# Patient Record
Sex: Female | Born: 2000 | Race: White | Hispanic: No | Marital: Single | Smoking: Never smoker
Health system: Southern US, Community
[De-identification: ages and names within clinical notes are randomized; demographics above are authoritative.]

## PROBLEM LIST (undated history)

## (undated) DIAGNOSIS — D649 Anemia, unspecified: Secondary | ICD-10-CM

## (undated) DIAGNOSIS — O365990 Maternal care for other known or suspected poor fetal growth, unspecified trimester, not applicable or unspecified: Secondary | ICD-10-CM

## (undated) DIAGNOSIS — J45909 Unspecified asthma, uncomplicated: Secondary | ICD-10-CM

## (undated) DIAGNOSIS — K219 Gastro-esophageal reflux disease without esophagitis: Secondary | ICD-10-CM

## (undated) DIAGNOSIS — F419 Anxiety disorder, unspecified: Secondary | ICD-10-CM

## (undated) HISTORY — DX: Unspecified asthma, uncomplicated: J45.909

## (undated) HISTORY — PX: NO PAST SURGERIES: SHX2092

---
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
## 2019-10-18 ENCOUNTER — Ambulatory Visit: Payer: Medicaid Other | Attending: Internal Medicine

## 2019-10-18 DIAGNOSIS — Z23 Encounter for immunization: Secondary | ICD-10-CM | POA: Insufficient documentation

## 2019-10-18 NOTE — Progress Notes (Signed)
   Covid-19 Vaccination Clinic  Name:  Megan Diaz    MRN: [id number] DOB: 02-11-2001  10/18/2019  Megan Diaz was observed post Covid-19 immunization for 15 minutes without incidence. She was provided with Vaccine Information Sheet and instruction to access the V-Safe system.   Megan Diaz was instructed to call 911 with any severe reactions post vaccine: Marland Kitchen Difficulty breathing  . Swelling of your face and throat  . A fast heartbeat  . A bad rash all over your body  . Dizziness and weakness    Immunizations Administered    Name Date Dose VIS Date Route   Pfizer COVID-19 Vaccine 10/18/2019 11:33 AM 0.3 mL 07/31/2019 Intramuscular   Manufacturer: [employer], [employer]   Lot: [id number]   NDC: [address]-2

## 2019-11-10 ENCOUNTER — Ambulatory Visit: Payer: Medicaid Other | Attending: Internal Medicine

## 2019-11-10 DIAGNOSIS — Z23 Encounter for immunization: Secondary | ICD-10-CM

## 2019-11-10 NOTE — Progress Notes (Signed)
   Covid-19 Vaccination Clinic  Name:  Megan Diaz    MRN: [id number] DOB: 27-Feb-2001  11/10/2019  [name] was observed post Covid-19 immunization for 15 minutes without incident. She was provided with Vaccine Information Sheet and instruction to access the V-Safe system.   [name] was instructed to call 911 with any severe reactions post vaccine: Marland Kitchen Difficulty breathing  . Swelling of face and throat  . A fast heartbeat  . A bad rash all over body  . Dizziness and weakness   Immunizations Administered    Name Date Dose VIS Date Route   Pfizer COVID-19 Vaccine 11/10/2019  3:21 PM 0.3 mL 07/31/2019 Intramuscular   Manufacturer: [employer], [employer]   Lot: [id number]   NDC: [address]-1

## 2020-07-06 ENCOUNTER — Ambulatory Visit: Payer: Medicaid Other | Admitting: Nurse Practitioner

## 2020-07-11 ENCOUNTER — Ambulatory Visit: Payer: Medicaid Other | Admitting: Nurse Practitioner

## 2021-09-22 ENCOUNTER — Other Ambulatory Visit: Payer: Self-pay

## 2021-09-22 DIAGNOSIS — O30049 Twin pregnancy, dichorionic/diamniotic, unspecified trimester: Secondary | ICD-10-CM

## 2021-10-18 LAB — OB RESULTS CONSOLE HEPATITIS B SURFACE ANTIGEN: Hepatitis B Surface Ag: NEGATIVE

## 2021-10-18 LAB — OB RESULTS CONSOLE RPR: RPR: NONREACTIVE

## 2021-10-18 LAB — OB RESULTS CONSOLE GC/CHLAMYDIA
Chlamydia: NEGATIVE
Neisseria Gonorrhea: NEGATIVE

## 2021-10-18 LAB — OB RESULTS CONSOLE VARICELLA ZOSTER ANTIBODY, IGG: Varicella: IMMUNE

## 2021-10-18 LAB — OB RESULTS CONSOLE RUBELLA ANTIBODY, IGM: Rubella: IMMUNE

## 2021-10-18 LAB — OB RESULTS CONSOLE HIV ANTIBODY (ROUTINE TESTING): HIV: NONREACTIVE

## 2021-10-19 ENCOUNTER — Other Ambulatory Visit: Payer: Self-pay

## 2021-10-19 DIAGNOSIS — O30042 Twin pregnancy, dichorionic/diamniotic, second trimester: Secondary | ICD-10-CM

## 2021-10-24 ENCOUNTER — Ambulatory Visit (HOSPITAL_BASED_OUTPATIENT_CLINIC_OR_DEPARTMENT_OTHER): Payer: BC Managed Care – PPO

## 2021-10-24 ENCOUNTER — Ambulatory Visit: Payer: BC Managed Care – PPO | Attending: Obstetrics and Gynecology

## 2021-10-24 ENCOUNTER — Ambulatory Visit (HOSPITAL_BASED_OUTPATIENT_CLINIC_OR_DEPARTMENT_OTHER): Payer: BC Managed Care – PPO | Admitting: Obstetrics and Gynecology

## 2021-10-24 ENCOUNTER — Other Ambulatory Visit: Payer: Self-pay

## 2021-10-24 ENCOUNTER — Other Ambulatory Visit: Payer: Self-pay | Admitting: Obstetrics and Gynecology

## 2021-10-24 DIAGNOSIS — Z79899 Other long term (current) drug therapy: Secondary | ICD-10-CM | POA: Insufficient documentation

## 2021-10-24 DIAGNOSIS — F419 Anxiety disorder, unspecified: Secondary | ICD-10-CM | POA: Diagnosis not present

## 2021-10-24 DIAGNOSIS — Z3A15 15 weeks gestation of pregnancy: Secondary | ICD-10-CM

## 2021-10-24 DIAGNOSIS — Z8279 Family history of other congenital malformations, deformations and chromosomal abnormalities: Secondary | ICD-10-CM

## 2021-10-24 DIAGNOSIS — O30042 Twin pregnancy, dichorionic/diamniotic, second trimester: Secondary | ICD-10-CM

## 2021-10-24 DIAGNOSIS — O99342 Other mental disorders complicating pregnancy, second trimester: Secondary | ICD-10-CM | POA: Diagnosis not present

## 2021-10-24 DIAGNOSIS — O99512 Diseases of the respiratory system complicating pregnancy, second trimester: Secondary | ICD-10-CM | POA: Insufficient documentation

## 2021-10-24 DIAGNOSIS — J45909 Unspecified asthma, uncomplicated: Secondary | ICD-10-CM | POA: Diagnosis not present

## 2021-10-24 DIAGNOSIS — Z363 Encounter for antenatal screening for malformations: Secondary | ICD-10-CM | POA: Diagnosis not present

## 2021-10-24 DIAGNOSIS — Z7982 Long term (current) use of aspirin: Secondary | ICD-10-CM | POA: Diagnosis not present

## 2021-10-24 DIAGNOSIS — O30049 Twin pregnancy, dichorionic/diamniotic, unspecified trimester: Secondary | ICD-10-CM

## 2021-10-24 NOTE — Progress Notes (Signed)
Megan Diaz Length of Consultation: 30 minutes   Megan Diaz  was referred to [facility] at [facility] for genetic counseling to review prenatal screening and testing options for this twin pregnancy.  In the family history, there is a history of Turner syndrome and Down syndrome.  This note summarizes the information we discussed with the patient and her mother at this visit.  She also had an ultrasound at this visit.  See that report under separate cover.    We offered the following routine screening tests for this pregnancy:  Cell free fetal DNA testing from maternal blood may be used to determine whether a baby is at high risk for Down syndrome, trisomy 104, or trisomy 4.  This test utilizes a maternal blood sample and DNA sequencing technology to isolate circulating cell free fetal DNA from maternal plasma.  The fetal DNA can then be analyzed for DNA sequences that are derived from the three most common chromosomes involved in aneuploidy, chromosomes 13, 18, and 21.  If the overall amount of DNA is greater than the expected level for any of these chromosomes, aneuploidy is suspected.  The detection rate for Down syndrome and trisomy 18 is >99% and the detection rate for trisomy 13 is >91%. While we do not consider it a replacement for invasive testing and karyotype analysis, a negative result from this testing would be reassuring, though not a guarantee of a normal chromosome complement for the baby.  An abnormal result is certainly suggestive of an abnormal chromosome complement, though we would still recommend CVS or amniocentesis to confirm any findings from this testing. This testing can also assess for the sex chromosomes and can detect approximately 96% of sex chromosome aneuploidies and determine fetal gender with >99% confidence.  Given the fact that this is a twin pregnancy, we would recommend testing with the Panorama screening, as it can also assess for zygosity.  Maternal  serum marker screening, a blood test that measures pregnancy proteins, can provide risk assessments for Down syndrome, trisomy 18, and open neural tube defects (spina bifida, anencephaly). Because it does not directly examine the fetus, it cannot positively diagnose or rule out these problems. This testing is less accurate in twin gestations. If cell free DNA testing is performed, then an AFP only should be made available in the second trimester to assess the chance for open neural tube defects.  Targeted ultrasound uses high frequency sound waves to create an image of the developing fetus.  An ultrasound is often recommended as a routine means of evaluating the pregnancy.  It is also used to screen for fetal anatomy problems (for example, a heart defect) that might be suggestive of a chromosomal or other abnormality.   Should these screening tests indicate an increased concern, then the following additional testing options would be offered:  Amniocentesis involves the removal of a small amount of amniotic fluid from the sac surrounding the fetus with the use of a thin needle inserted through the maternal abdomen and uterus.  Ultrasound guidance is used throughout the procedure.  Fetal cells from amniotic fluid are directly evaluated and > 99.5% of chromosome problems and > 98% of open neural tube defects can be detected. This procedure is generally performed after the 15th week of pregnancy.  The main risks to this procedure include complications leading to miscarriage in less than 1 in 200 cases (0.5%).  Cystic Fibrosis and Spinal Muscular Atrophy (SMA) screening were also discussed with the patient.  Both conditions are recessive, which means that both parents must be carriers in order to have a child with the disease.  Cystic fibrosis (CF) is one of the most common genetic conditions in persons of Caucasian ancestry.  This condition occurs in approximately 1 in 2,500 Caucasian persons and results in  thickened secretions in the lungs, digestive, and reproductive systems.  For a baby to be at risk for having CF, both of the parents must be carriers for this condition.  Approximately 1 in 58 Caucasian persons is a carrier for CF.  Current carrier testing looks for the most common mutations in the gene for CF and can detect approximately 90% of carriers in the Caucasian population.  This means that the carrier screening can greatly reduce, but cannot eliminate, the chance for an individual to have a child with CF.  If an individual is found to be a carrier for CF, then carrier testing would be available for the partner. As part of [address] newborn screening profile, all babies born in the state of [place] will have a two-tier screening process.  Specimens are first tested to determine the concentration of immunoreactive trypsinogen (IRT).  The top 5% of specimens with the highest IRT values then undergo DNA testing using a panel of over 40 common CF mutations. SMA is a neurodegenerative disorder that leads to atrophy of skeletal muscle and overall weakness.  This condition is also more prevalent in the Caucasian population, with 1 in 40-1 in 60 persons being a carrier and 1 in 6,000-1 in 10,000 children being affected.  There are multiple forms of the disease, with some causing death in infancy to other forms with survival into adulthood.  The genetics of SMA is complex, but carrier screening can detect up to 95% of carriers in the Caucasian population.  Similar to CF, a negative result can greatly reduce, but cannot eliminate, the chance to have a child with SMA. Hemoglobinopathy screening was also made available.  We obtained a detailed family history and pregnancy history, with the following concerns reported: The father of the pregnancy, Theone Murdoch, has a sister with Turner syndrome.  Turner syndrome, also known as Monosomy X (45,X) typically results from the loss of one X chromosome in affected  females. This most often occurs due to a random error in chromosomal division during the formation of egg or sperm in a process called nondisjunction. Rarely, Turner syndrome may be caused by a partial deletion of an X chromosome, which can be inherited from a parent. Because Turner syndrome is most often not inherited, recurrence is rare; however, cases of recurrence have been reported. Cell free DNA screening can assess for this condition. The patient has a maternal second cousin and another more distant maternal relative with Down syndrome.  Down syndrome is caused by an extra copy of the genetic instructions located on chromosome number 21.  These extra instructions result in the characteristic facial appearance, intellectual disabilities and an increased risk for some types of birth defects.  The majority (95%) of persons with Down syndrome have three freestanding copies of chromosome number 21, called trisomy 21.  This type of Down syndrome occurs as a sporadic condition and does not increase the chance for other family members to have Down syndrome.  Rarely, Down syndrome is caused by a rearrangement of the genetic instructions, where the extra chromosome number 21 is attached to another chromosome.  This type of chromosome rearrangement can be passed down through families and therefore  may increase the chance for Down syndrome in other family members.  We cannot determine which type of Down syndrome is present without documentation of chromosome studies in the affected family member.  If cell free DNA testing is performed, it can detect greater than 99% of pregnancies with Down syndrome. The patient also has a maternal first cousin once removed who is reported to have had seizures, developmental delay, immune issues and lung problems possibly related to neonatal infection and encephalitis.  He passed away due to complications of COVID.  If his health concerns were related to the illness/infection after  birth, then we would not expect an increased risk for any other family members. If it were due to a different, possibly inherited, underlying cause, then there may be risks.  If more is learned about the cause for his condition, we are happy to review this history further.  The remainder of the family history was reported to be unremarkable for birth defects, intellectual delays, recurrent pregnancy loss or known chromosome abnormalities.  Megan Diaz stated that this is the first pregnancy for she and her partner, Eli.  She reported no complications or exposures to medications, recreational drugs or tobacco.  She had a small amount of alcohol prior to learning that she was pregnant at approximately 5 weeks.  We reviewed the all or none period.  An ultrasound was performed at the time of the visit.  The gestational age was consistent with 15 weeks.  Fetal anatomy could not be fully assessed due to early gestational age.  Please refer to the ultrasound report for details of that study.  Ms. Bortz was encouraged to call with questions or concerns.  We can be contacted at [phone].  Plan of care: Return for anatomy ultrasound in 5 weeks in our [place] office. Speak with partner and decide about Panorama testing as well as [employer] carrier screening.  Patient has my card to call if testing is desired.  Cherly Anderson, MS, CGC

## 2021-10-24 NOTE — Progress Notes (Signed)
Maternal-Fetal Medicine  ? ?Name: Megan Diaz ?DOB: December 02, 2000 ?MRN: [id number] ?Referring Provider: Margaretmary Eddy, CNM ? ?I had the pleasure of seeing Megan Diaz today at [facility], [facility].  She was accompanied by her mother. She is G1 P0 at 15w 1d gestation with twin pregnancy and is here for ultrasound evaluation and consultation. ? ?This is a natural conception.  Her pregnancy is dated by 10-week ultrasound performed at your office.  Dichorionic/diamniotic twin pregnancy was confirmed. ? ?Past medical history: No history of hypertension or diabetes or any chronic medical conditions.  She has anxiety. ?Past surgical history: Nil of note. ?Medications: Prenatal vitamins, Lexapro 10 mg daily. ?Allergies: Cefuroxime (?hives?). ?Social history: Denies tobacco or drug or alcohol use.  Her partner is Caucasian and he is in good health. ?Family history: Mother has hypertension and diabetes.  She has no knowledge of her father's medical condition.  No history of venous thromboembolism in the family. ?GYN history: No history of abnormal Pap smears or cervical surgeries ? ?Ultrasound ?We confirm dichorionic-diamniotic twin pregnancy. ?Twin A: Lower fetus and slightly to maternal right, posterior placenta, female fetus.  Fetal biometry is consistent with the previously established dates.  Amniotic fluid is normal good fetal activity seen.  Fetal anatomical survey is very limited because of early gestational age.  No obvious fetal structural defects are seen. ?Twin B: Upper fetus, posterior placenta.  Fetal biometry is consistent with the previously established dates. Amniotic fluid is normal good fetal activity seen.  Fetal anatomical survey is very limited because of early gestational age.  No obvious fetal structural defects are seen. ?Growth discordancy: 7% (normal). ?On transabdominal scan, cervix looks long and closed. ? ?Dichorionic-diamniotic twin pregnancy ?I explained the significance of chorionicity and its  implications. Possible complications associated with twin pregnancy include preterm labor/delivery (most common), fetal growth restriction of one or both twins, miscarriage, malpresentations and increased cesarean delivery rate, postpartum hemorrhage. I also informed her that maternal complications including gestational diabetes and gestational hypertension/preeclampsia are more common. ?I discussed the mode of delivery that is based on the presentations.  If both have Vx/Vx or Vx/non-vertex presentations, vaginal delivery may be considered. In Vx/non-vx, vaginal delivery followed by internal podalic version of second twin will achieve vaginal delivery. In non-vx first twin presentation, cesarean delivery will be performed. ? ?I emphasized the importance of weight gain (24 lbs by 24 weeks) to improve fetal weight and decrease the chances of preterm delivery. ?Low-dose aspirin is beneficial in preventing preeclampsia. Twin pregnancy is at high risk for preeclampsia. ?I recommended aspirin (81 mg daily) to be started from now until delivery. She does not have contraindications to aspirin. ? ?Lexapro (Escitalopram) ?No increased fetal congenital malformations have been reported with the use of this drug in early pregnancy. A case-controlled study suggested the association of primary pulmonary hypertension (PPHN). However, the absolute risk is very small (less than 1%). Also, in the absence of other conditions like meconium aspiration syndrome and congenital diaphragmatic hernia, the chances of PPHN are low and even if they occur they tend to resolve with treatment.  About 10% to 30% of neonates exposed to SSRIs in utero tend to develop poor neonatal adaptation syndrome (irritability, tremor, seizures and resembling withdrawal from opioids) that resolve without any long-term neurological adverse outcomes. ? ?Breastfeeding is not contraindicated in patients taking SSRIs. In general, untreated depression may have adverse  effects in the pregnant mother including preterm delivery and growth restriction. Depression in late pregnancy is a strong predictor  for postpartum depression.  ?I reassured the patient of low-likelihood of fetal congenital malformation with this drug. If she does not have anxiety, she may discontinue this medication in pregnancy after discussing with you. ? ?Recommendations ?-An appointment was made for her to return in 5 weeks for detailed fetal anatomical survey. ?-Serial fetal growth assessments every 4 weeks. ?-Weekly antenatal testing at 36 and 37 weeks. ?-Delivery at 38 weeks.  ?-Weight gain of about 24 lbs by 24 weeks is recommended. ?-Aspirin 81 mg daily until delivery. ?-Patient will discuss with her partner and decide on cell-free fetal DNA screening. ? ?Thank you for consultation.  If you have any questions or concerns, please contact me the [facility].  Consultation including face-to-face counseling (more than 50% of time spent) is 45 minutes. ? ? ? ? ?

## 2021-11-28 ENCOUNTER — Ambulatory Visit: Payer: BC Managed Care – PPO | Admitting: *Deleted

## 2021-11-28 ENCOUNTER — Ambulatory Visit: Payer: BC Managed Care – PPO | Attending: Obstetrics and Gynecology

## 2021-11-28 VITALS — BP 115/81 | HR 95

## 2021-11-28 DIAGNOSIS — O30042 Twin pregnancy, dichorionic/diamniotic, second trimester: Secondary | ICD-10-CM | POA: Insufficient documentation

## 2021-11-28 DIAGNOSIS — F419 Anxiety disorder, unspecified: Secondary | ICD-10-CM

## 2021-11-28 DIAGNOSIS — O30049 Twin pregnancy, dichorionic/diamniotic, unspecified trimester: Secondary | ICD-10-CM

## 2021-11-28 DIAGNOSIS — O99342 Other mental disorders complicating pregnancy, second trimester: Secondary | ICD-10-CM | POA: Diagnosis not present

## 2021-11-28 DIAGNOSIS — O99512 Diseases of the respiratory system complicating pregnancy, second trimester: Secondary | ICD-10-CM | POA: Diagnosis not present

## 2021-11-28 DIAGNOSIS — J45909 Unspecified asthma, uncomplicated: Secondary | ICD-10-CM

## 2021-11-28 DIAGNOSIS — Z3A20 20 weeks gestation of pregnancy: Secondary | ICD-10-CM

## 2021-11-29 ENCOUNTER — Other Ambulatory Visit: Payer: Self-pay | Admitting: *Deleted

## 2021-11-29 DIAGNOSIS — J45909 Unspecified asthma, uncomplicated: Secondary | ICD-10-CM

## 2021-11-29 DIAGNOSIS — F419 Anxiety disorder, unspecified: Secondary | ICD-10-CM

## 2021-11-29 DIAGNOSIS — O30042 Twin pregnancy, dichorionic/diamniotic, second trimester: Secondary | ICD-10-CM

## 2021-12-28 ENCOUNTER — Ambulatory Visit: Payer: BC Managed Care – PPO | Attending: Obstetrics

## 2021-12-28 ENCOUNTER — Other Ambulatory Visit: Payer: Self-pay | Admitting: Obstetrics

## 2021-12-28 ENCOUNTER — Ambulatory Visit: Payer: BC Managed Care – PPO | Admitting: *Deleted

## 2021-12-28 VITALS — BP 120/64 | HR 74

## 2021-12-28 DIAGNOSIS — J45909 Unspecified asthma, uncomplicated: Secondary | ICD-10-CM

## 2021-12-28 DIAGNOSIS — O30042 Twin pregnancy, dichorionic/diamniotic, second trimester: Secondary | ICD-10-CM | POA: Insufficient documentation

## 2021-12-28 DIAGNOSIS — O99512 Diseases of the respiratory system complicating pregnancy, second trimester: Secondary | ICD-10-CM

## 2021-12-28 DIAGNOSIS — O99519 Diseases of the respiratory system complicating pregnancy, unspecified trimester: Secondary | ICD-10-CM | POA: Diagnosis present

## 2021-12-28 DIAGNOSIS — O99340 Other mental disorders complicating pregnancy, unspecified trimester: Secondary | ICD-10-CM | POA: Insufficient documentation

## 2021-12-28 DIAGNOSIS — Z3A24 24 weeks gestation of pregnancy: Secondary | ICD-10-CM

## 2021-12-28 DIAGNOSIS — F419 Anxiety disorder, unspecified: Secondary | ICD-10-CM

## 2021-12-28 DIAGNOSIS — O365922 Maternal care for other known or suspected poor fetal growth, second trimester, fetus 2: Secondary | ICD-10-CM

## 2021-12-28 DIAGNOSIS — O365921 Maternal care for other known or suspected poor fetal growth, second trimester, fetus 1: Secondary | ICD-10-CM

## 2021-12-29 ENCOUNTER — Other Ambulatory Visit: Payer: Self-pay | Admitting: *Deleted

## 2021-12-29 DIAGNOSIS — O30049 Twin pregnancy, dichorionic/diamniotic, unspecified trimester: Secondary | ICD-10-CM

## 2021-12-29 DIAGNOSIS — Z3689 Encounter for other specified antenatal screening: Secondary | ICD-10-CM

## 2022-01-02 ENCOUNTER — Telehealth: Payer: Self-pay

## 2022-01-02 NOTE — Telephone Encounter (Signed)
Left message about appointment added for 01/17/22 ?

## 2022-01-12 ENCOUNTER — Encounter: Payer: Self-pay | Admitting: *Deleted

## 2022-01-12 ENCOUNTER — Ambulatory Visit: Payer: BC Managed Care – PPO | Admitting: *Deleted

## 2022-01-12 ENCOUNTER — Ambulatory Visit: Payer: BC Managed Care – PPO | Attending: Obstetrics and Gynecology

## 2022-01-12 DIAGNOSIS — F419 Anxiety disorder, unspecified: Secondary | ICD-10-CM | POA: Diagnosis not present

## 2022-01-12 DIAGNOSIS — O365922 Maternal care for other known or suspected poor fetal growth, second trimester, fetus 2: Secondary | ICD-10-CM | POA: Diagnosis not present

## 2022-01-12 DIAGNOSIS — O99342 Other mental disorders complicating pregnancy, second trimester: Secondary | ICD-10-CM

## 2022-01-12 DIAGNOSIS — O365921 Maternal care for other known or suspected poor fetal growth, second trimester, fetus 1: Secondary | ICD-10-CM | POA: Diagnosis not present

## 2022-01-12 DIAGNOSIS — J45909 Unspecified asthma, uncomplicated: Secondary | ICD-10-CM

## 2022-01-12 DIAGNOSIS — O99512 Diseases of the respiratory system complicating pregnancy, second trimester: Secondary | ICD-10-CM

## 2022-01-12 DIAGNOSIS — Z3689 Encounter for other specified antenatal screening: Secondary | ICD-10-CM | POA: Diagnosis present

## 2022-01-12 DIAGNOSIS — Z3A26 26 weeks gestation of pregnancy: Secondary | ICD-10-CM

## 2022-01-12 DIAGNOSIS — O30049 Twin pregnancy, dichorionic/diamniotic, unspecified trimester: Secondary | ICD-10-CM | POA: Insufficient documentation

## 2022-01-17 ENCOUNTER — Ambulatory Visit: Payer: BC Managed Care – PPO | Admitting: *Deleted

## 2022-01-17 ENCOUNTER — Ambulatory Visit: Payer: BC Managed Care – PPO | Attending: Obstetrics and Gynecology

## 2022-01-17 VITALS — BP 113/64 | HR 91

## 2022-01-17 DIAGNOSIS — O30049 Twin pregnancy, dichorionic/diamniotic, unspecified trimester: Secondary | ICD-10-CM | POA: Insufficient documentation

## 2022-01-17 DIAGNOSIS — O99342 Other mental disorders complicating pregnancy, second trimester: Secondary | ICD-10-CM

## 2022-01-17 DIAGNOSIS — O30042 Twin pregnancy, dichorionic/diamniotic, second trimester: Secondary | ICD-10-CM | POA: Insufficient documentation

## 2022-01-17 DIAGNOSIS — Z3A27 27 weeks gestation of pregnancy: Secondary | ICD-10-CM

## 2022-01-17 DIAGNOSIS — O365922 Maternal care for other known or suspected poor fetal growth, second trimester, fetus 2: Secondary | ICD-10-CM

## 2022-01-17 DIAGNOSIS — O99512 Diseases of the respiratory system complicating pregnancy, second trimester: Secondary | ICD-10-CM

## 2022-01-17 DIAGNOSIS — J45909 Unspecified asthma, uncomplicated: Secondary | ICD-10-CM

## 2022-01-17 DIAGNOSIS — Z3689 Encounter for other specified antenatal screening: Secondary | ICD-10-CM | POA: Diagnosis present

## 2022-01-17 DIAGNOSIS — F419 Anxiety disorder, unspecified: Secondary | ICD-10-CM

## 2022-01-17 DIAGNOSIS — O365921 Maternal care for other known or suspected poor fetal growth, second trimester, fetus 1: Secondary | ICD-10-CM | POA: Diagnosis not present

## 2022-01-18 ENCOUNTER — Other Ambulatory Visit: Payer: Self-pay | Admitting: *Deleted

## 2022-01-18 DIAGNOSIS — O365922 Maternal care for other known or suspected poor fetal growth, second trimester, fetus 2: Secondary | ICD-10-CM

## 2022-01-18 DIAGNOSIS — O30042 Twin pregnancy, dichorionic/diamniotic, second trimester: Secondary | ICD-10-CM

## 2022-01-18 DIAGNOSIS — O365921 Maternal care for other known or suspected poor fetal growth, second trimester, fetus 1: Secondary | ICD-10-CM

## 2022-01-18 DIAGNOSIS — J45909 Unspecified asthma, uncomplicated: Secondary | ICD-10-CM

## 2022-01-23 ENCOUNTER — Ambulatory Visit: Payer: BC Managed Care – PPO | Attending: Obstetrics and Gynecology

## 2022-01-23 ENCOUNTER — Encounter: Payer: Self-pay | Admitting: *Deleted

## 2022-01-23 ENCOUNTER — Ambulatory Visit: Payer: BC Managed Care – PPO | Admitting: *Deleted

## 2022-01-23 ENCOUNTER — Ambulatory Visit (HOSPITAL_BASED_OUTPATIENT_CLINIC_OR_DEPARTMENT_OTHER): Payer: BC Managed Care – PPO | Admitting: *Deleted

## 2022-01-23 VITALS — BP 126/85 | HR 105

## 2022-01-23 DIAGNOSIS — O365922 Maternal care for other known or suspected poor fetal growth, second trimester, fetus 2: Secondary | ICD-10-CM | POA: Diagnosis present

## 2022-01-23 DIAGNOSIS — O99343 Other mental disorders complicating pregnancy, third trimester: Secondary | ICD-10-CM

## 2022-01-23 DIAGNOSIS — J45909 Unspecified asthma, uncomplicated: Secondary | ICD-10-CM | POA: Diagnosis present

## 2022-01-23 DIAGNOSIS — O30042 Twin pregnancy, dichorionic/diamniotic, second trimester: Secondary | ICD-10-CM

## 2022-01-23 DIAGNOSIS — O30043 Twin pregnancy, dichorionic/diamniotic, third trimester: Secondary | ICD-10-CM | POA: Diagnosis present

## 2022-01-23 DIAGNOSIS — O99519 Diseases of the respiratory system complicating pregnancy, unspecified trimester: Secondary | ICD-10-CM | POA: Diagnosis present

## 2022-01-23 DIAGNOSIS — Z3A28 28 weeks gestation of pregnancy: Secondary | ICD-10-CM | POA: Insufficient documentation

## 2022-01-23 DIAGNOSIS — O365931 Maternal care for other known or suspected poor fetal growth, third trimester, fetus 1: Secondary | ICD-10-CM | POA: Insufficient documentation

## 2022-01-23 DIAGNOSIS — O365921 Maternal care for other known or suspected poor fetal growth, second trimester, fetus 1: Secondary | ICD-10-CM | POA: Diagnosis present

## 2022-01-23 DIAGNOSIS — O365932 Maternal care for other known or suspected poor fetal growth, third trimester, fetus 2: Secondary | ICD-10-CM | POA: Insufficient documentation

## 2022-01-23 DIAGNOSIS — F419 Anxiety disorder, unspecified: Secondary | ICD-10-CM

## 2022-01-23 NOTE — Procedures (Signed)
Megan Diaz 2001/06/29 [redacted]w[redacted]d   Fetus B Non-Stress Test Interpretation for 01/23/22  Indication: IUGR  Fetal Heart Rate Fetus B Mode: External Baseline Rate (B): 140 BPM Variability: Moderate Accelerations: 10 x 10 Decelerations: None  Uterine Activity Mode: Palpation, Toco Contraction Frequency (min): ui Resting Tone Palpated: Relaxed  Interpretation (Baby B - Fetal Testing) Nonstress Test Interpretation (Baby B): Reactive Overall Impression (Baby B): Reassuring for gestational age Comments (Baby B): Dr. Parke Poisson reviewed tracing  Megan Diaz [birth year] [redacted]w[redacted]d  Fetus A Non-Stress Test Interpretation for 01/23/22  Indication: IUGR  Fetal Heart Rate A Mode: External Baseline Rate (A): 140 bpm Variability: Moderate Accelerations: 10 x 10 Decelerations: None Multiple birth?: Yes  Uterine Activity Mode: Palpation, Toco Contraction Frequency (min): ui Resting Tone Palpated: Relaxed  Interpretation (Fetal Testing) Nonstress Test Interpretation: Reactive Overall Impression: Reassuring for gestational age Comments: Dr. Parke Poisson reviewed tracing.

## 2022-02-02 ENCOUNTER — Ambulatory Visit: Payer: BC Managed Care – PPO | Attending: Obstetrics and Gynecology

## 2022-02-02 ENCOUNTER — Ambulatory Visit (HOSPITAL_BASED_OUTPATIENT_CLINIC_OR_DEPARTMENT_OTHER): Payer: BC Managed Care – PPO | Admitting: *Deleted

## 2022-02-02 ENCOUNTER — Ambulatory Visit: Payer: BC Managed Care – PPO | Admitting: *Deleted

## 2022-02-02 ENCOUNTER — Other Ambulatory Visit: Payer: Self-pay | Admitting: *Deleted

## 2022-02-02 VITALS — BP 124/66 | HR 76

## 2022-02-02 DIAGNOSIS — O99519 Diseases of the respiratory system complicating pregnancy, unspecified trimester: Secondary | ICD-10-CM | POA: Diagnosis present

## 2022-02-02 DIAGNOSIS — O365930 Maternal care for other known or suspected poor fetal growth, third trimester, not applicable or unspecified: Secondary | ICD-10-CM | POA: Insufficient documentation

## 2022-02-02 DIAGNOSIS — O365931 Maternal care for other known or suspected poor fetal growth, third trimester, fetus 1: Secondary | ICD-10-CM

## 2022-02-02 DIAGNOSIS — O365130 Maternal care for known or suspected placental insufficiency, third trimester, not applicable or unspecified: Secondary | ICD-10-CM | POA: Diagnosis not present

## 2022-02-02 DIAGNOSIS — F419 Anxiety disorder, unspecified: Secondary | ICD-10-CM

## 2022-02-02 DIAGNOSIS — O30043 Twin pregnancy, dichorionic/diamniotic, third trimester: Secondary | ICD-10-CM | POA: Insufficient documentation

## 2022-02-02 DIAGNOSIS — O365922 Maternal care for other known or suspected poor fetal growth, second trimester, fetus 2: Secondary | ICD-10-CM | POA: Insufficient documentation

## 2022-02-02 DIAGNOSIS — O365932 Maternal care for other known or suspected poor fetal growth, third trimester, fetus 2: Secondary | ICD-10-CM

## 2022-02-02 DIAGNOSIS — O365921 Maternal care for other known or suspected poor fetal growth, second trimester, fetus 1: Secondary | ICD-10-CM | POA: Diagnosis present

## 2022-02-02 DIAGNOSIS — Z3A29 29 weeks gestation of pregnancy: Secondary | ICD-10-CM | POA: Diagnosis present

## 2022-02-02 DIAGNOSIS — J45909 Unspecified asthma, uncomplicated: Secondary | ICD-10-CM | POA: Diagnosis not present

## 2022-02-02 DIAGNOSIS — O99343 Other mental disorders complicating pregnancy, third trimester: Secondary | ICD-10-CM

## 2022-02-02 DIAGNOSIS — O99513 Diseases of the respiratory system complicating pregnancy, third trimester: Secondary | ICD-10-CM

## 2022-02-02 DIAGNOSIS — O30042 Twin pregnancy, dichorionic/diamniotic, second trimester: Secondary | ICD-10-CM | POA: Insufficient documentation

## 2022-02-02 NOTE — Procedures (Signed)
Megan Diaz 11/28/2000 [redacted]w[redacted]d  Fetus A Non-Stress Test Interpretation for 02/02/22  Indication: IUGR  Fetal Heart Rate A Mode: External Baseline Rate (A): 135 bpm Variability: Moderate Accelerations: 10 x 10 Decelerations: None Multiple birth?: Yes  Uterine Activity Mode: Palpation, Toco Contraction Frequency (min): UI Contraction Quality: Mild Resting Tone Palpated: Relaxed Resting Time: Adequate  Interpretation (Fetal Testing) Nonstress Test Interpretation: Reactive Overall Impression: Reassuring for gestational age Comments: Baby A reactive. Unable to trace Baby B though heart rate heard at intervals. Attempted to hold cardio without success. Both babies moving well. Dr. Parke Poisson reviewed tracing.  Megan Diaz [birth year] [redacted]w[redacted]d   Fetus B Non-Stress Test Interpretation for 02/02/22  Indication: IUGR  Fetal Heart Rate Fetus B Mode: External Baseline Rate (B):  (Unable to obtain baseline.)  Uterine Activity Mode: Palpation, Toco Contraction Frequency (min): UI Contraction Quality: Mild Resting Tone Palpated: Relaxed Resting Time: Adequate  Interpretation (Baby B - Fetal Testing) Comments (Baby B): See note above.

## 2022-02-08 ENCOUNTER — Other Ambulatory Visit: Payer: BC Managed Care – PPO

## 2022-02-08 ENCOUNTER — Other Ambulatory Visit: Payer: Self-pay | Admitting: *Deleted

## 2022-02-08 ENCOUNTER — Ambulatory Visit: Payer: BC Managed Care – PPO | Admitting: *Deleted

## 2022-02-08 ENCOUNTER — Ambulatory Visit: Payer: BC Managed Care – PPO | Attending: Obstetrics and Gynecology

## 2022-02-08 VITALS — BP 114/69 | HR 84

## 2022-02-08 DIAGNOSIS — O365931 Maternal care for other known or suspected poor fetal growth, third trimester, fetus 1: Secondary | ICD-10-CM

## 2022-02-08 DIAGNOSIS — O365921 Maternal care for other known or suspected poor fetal growth, second trimester, fetus 1: Secondary | ICD-10-CM

## 2022-02-08 DIAGNOSIS — O365922 Maternal care for other known or suspected poor fetal growth, second trimester, fetus 2: Secondary | ICD-10-CM

## 2022-02-08 DIAGNOSIS — O99519 Diseases of the respiratory system complicating pregnancy, unspecified trimester: Secondary | ICD-10-CM | POA: Insufficient documentation

## 2022-02-08 DIAGNOSIS — O30043 Twin pregnancy, dichorionic/diamniotic, third trimester: Secondary | ICD-10-CM

## 2022-02-08 DIAGNOSIS — J45909 Unspecified asthma, uncomplicated: Secondary | ICD-10-CM

## 2022-02-08 DIAGNOSIS — O365932 Maternal care for other known or suspected poor fetal growth, third trimester, fetus 2: Secondary | ICD-10-CM

## 2022-02-08 DIAGNOSIS — O99343 Other mental disorders complicating pregnancy, third trimester: Secondary | ICD-10-CM

## 2022-02-08 DIAGNOSIS — O99513 Diseases of the respiratory system complicating pregnancy, third trimester: Secondary | ICD-10-CM

## 2022-02-08 DIAGNOSIS — O365930 Maternal care for other known or suspected poor fetal growth, third trimester, not applicable or unspecified: Secondary | ICD-10-CM

## 2022-02-08 DIAGNOSIS — F419 Anxiety disorder, unspecified: Secondary | ICD-10-CM

## 2022-02-08 DIAGNOSIS — Z3A30 30 weeks gestation of pregnancy: Secondary | ICD-10-CM

## 2022-02-08 DIAGNOSIS — O30042 Twin pregnancy, dichorionic/diamniotic, second trimester: Secondary | ICD-10-CM

## 2022-02-15 ENCOUNTER — Ambulatory Visit: Payer: BC Managed Care – PPO | Admitting: *Deleted

## 2022-02-15 ENCOUNTER — Other Ambulatory Visit: Payer: BC Managed Care – PPO

## 2022-02-15 ENCOUNTER — Ambulatory Visit: Payer: BC Managed Care – PPO | Attending: Obstetrics and Gynecology

## 2022-02-15 ENCOUNTER — Other Ambulatory Visit: Payer: Self-pay | Admitting: *Deleted

## 2022-02-15 VITALS — BP 111/68 | HR 85

## 2022-02-15 DIAGNOSIS — J45909 Unspecified asthma, uncomplicated: Secondary | ICD-10-CM | POA: Insufficient documentation

## 2022-02-15 DIAGNOSIS — O365931 Maternal care for other known or suspected poor fetal growth, third trimester, fetus 1: Secondary | ICD-10-CM

## 2022-02-15 DIAGNOSIS — O365921 Maternal care for other known or suspected poor fetal growth, second trimester, fetus 1: Secondary | ICD-10-CM | POA: Insufficient documentation

## 2022-02-15 DIAGNOSIS — O30043 Twin pregnancy, dichorionic/diamniotic, third trimester: Secondary | ICD-10-CM

## 2022-02-15 DIAGNOSIS — O365932 Maternal care for other known or suspected poor fetal growth, third trimester, fetus 2: Secondary | ICD-10-CM | POA: Diagnosis present

## 2022-02-15 DIAGNOSIS — O30042 Twin pregnancy, dichorionic/diamniotic, second trimester: Secondary | ICD-10-CM | POA: Insufficient documentation

## 2022-02-15 DIAGNOSIS — O365922 Maternal care for other known or suspected poor fetal growth, second trimester, fetus 2: Secondary | ICD-10-CM

## 2022-02-15 DIAGNOSIS — F419 Anxiety disorder, unspecified: Secondary | ICD-10-CM

## 2022-02-15 DIAGNOSIS — O99343 Other mental disorders complicating pregnancy, third trimester: Secondary | ICD-10-CM

## 2022-02-15 DIAGNOSIS — O99519 Diseases of the respiratory system complicating pregnancy, unspecified trimester: Secondary | ICD-10-CM | POA: Insufficient documentation

## 2022-02-15 DIAGNOSIS — O99513 Diseases of the respiratory system complicating pregnancy, third trimester: Secondary | ICD-10-CM

## 2022-02-15 DIAGNOSIS — Z3A31 31 weeks gestation of pregnancy: Secondary | ICD-10-CM

## 2022-02-22 ENCOUNTER — Ambulatory Visit: Payer: BC Managed Care – PPO | Admitting: *Deleted

## 2022-02-22 ENCOUNTER — Ambulatory Visit: Payer: BC Managed Care – PPO | Attending: Obstetrics and Gynecology

## 2022-02-22 VITALS — BP 122/70 | HR 78

## 2022-02-22 DIAGNOSIS — O30043 Twin pregnancy, dichorionic/diamniotic, third trimester: Secondary | ICD-10-CM | POA: Diagnosis present

## 2022-02-22 DIAGNOSIS — J45909 Unspecified asthma, uncomplicated: Secondary | ICD-10-CM | POA: Insufficient documentation

## 2022-02-22 DIAGNOSIS — O365922 Maternal care for other known or suspected poor fetal growth, second trimester, fetus 2: Secondary | ICD-10-CM | POA: Diagnosis not present

## 2022-02-22 DIAGNOSIS — F419 Anxiety disorder, unspecified: Secondary | ICD-10-CM

## 2022-02-22 DIAGNOSIS — O365921 Maternal care for other known or suspected poor fetal growth, second trimester, fetus 1: Secondary | ICD-10-CM | POA: Diagnosis not present

## 2022-02-22 DIAGNOSIS — O99519 Diseases of the respiratory system complicating pregnancy, unspecified trimester: Secondary | ICD-10-CM | POA: Diagnosis not present

## 2022-02-22 DIAGNOSIS — O99343 Other mental disorders complicating pregnancy, third trimester: Secondary | ICD-10-CM

## 2022-02-22 DIAGNOSIS — O30042 Twin pregnancy, dichorionic/diamniotic, second trimester: Secondary | ICD-10-CM | POA: Insufficient documentation

## 2022-02-22 DIAGNOSIS — Z3A32 32 weeks gestation of pregnancy: Secondary | ICD-10-CM

## 2022-02-28 ENCOUNTER — Ambulatory Visit (HOSPITAL_BASED_OUTPATIENT_CLINIC_OR_DEPARTMENT_OTHER): Payer: BC Managed Care – PPO | Admitting: *Deleted

## 2022-02-28 ENCOUNTER — Ambulatory Visit: Payer: BC Managed Care – PPO | Attending: Obstetrics and Gynecology

## 2022-02-28 ENCOUNTER — Ambulatory Visit: Payer: BC Managed Care – PPO | Admitting: *Deleted

## 2022-02-28 VITALS — BP 115/72 | HR 88

## 2022-02-28 DIAGNOSIS — O365930 Maternal care for other known or suspected poor fetal growth, third trimester, not applicable or unspecified: Secondary | ICD-10-CM | POA: Diagnosis not present

## 2022-02-28 DIAGNOSIS — O30043 Twin pregnancy, dichorionic/diamniotic, third trimester: Secondary | ICD-10-CM | POA: Insufficient documentation

## 2022-02-28 DIAGNOSIS — O99513 Diseases of the respiratory system complicating pregnancy, third trimester: Secondary | ICD-10-CM

## 2022-02-28 DIAGNOSIS — Z3A33 33 weeks gestation of pregnancy: Secondary | ICD-10-CM

## 2022-02-28 DIAGNOSIS — O99343 Other mental disorders complicating pregnancy, third trimester: Secondary | ICD-10-CM

## 2022-02-28 DIAGNOSIS — J45909 Unspecified asthma, uncomplicated: Secondary | ICD-10-CM

## 2022-02-28 DIAGNOSIS — O365932 Maternal care for other known or suspected poor fetal growth, third trimester, fetus 2: Secondary | ICD-10-CM | POA: Insufficient documentation

## 2022-02-28 DIAGNOSIS — O365931 Maternal care for other known or suspected poor fetal growth, third trimester, fetus 1: Secondary | ICD-10-CM

## 2022-02-28 DIAGNOSIS — F419 Anxiety disorder, unspecified: Secondary | ICD-10-CM | POA: Diagnosis not present

## 2022-02-28 NOTE — Procedures (Signed)
Megan Diaz [birth year] [redacted]w[redacted]d   Fetus B Non-Stress Test Interpretation for 02/28/22  Indication: IUGR  Fetal Heart Rate Fetus B Mode: External Baseline Rate (B): 130 BPM Variability: Moderate Accelerations: 15 x 15 Decelerations: None  Uterine Activity Mode: Palpation, Toco Contraction Frequency (min): none Resting Tone Palpated: Relaxed  Interpretation (Baby B - Fetal Testing) Nonstress Test Interpretation (Baby B): Reactive Overall Impression (Baby B): Reassuring for gestational age Comments (Baby B): Dr. Grace Bushy reviewed tracing  Megan Diaz 05/21/2001 [redacted]w[redacted]d  Fetus A Non-Stress Test Interpretation for 02/28/22  Indication: IUGR  Fetal Heart Rate A Mode: External Baseline Rate (A): 135 bpm Variability: Moderate Accelerations: 15 x 15 Decelerations: Variable Multiple birth?: Yes  Uterine Activity Mode: Palpation, Toco Contraction Frequency (min): none Resting Tone Palpated: Relaxed  Interpretation (Fetal Testing) Nonstress Test Interpretation: Reactive Overall Impression: Reassuring for gestational age Comments: Dr. Grace Bushy reviewed tracing

## 2022-03-06 NOTE — H&P (Addendum)
Megan Diaz is a 21 y.o. female presenting for primary LTCS for Di/DI twin with severe growth restriction  EDC 04/16/22 EGA 36+4 weeks  20 y.o. G1P0000 at [redacted]w[redacted]d Patient's last menstrual period was 07/17/2021 (exact date). inconsistent with  with ultrasound @ [redacted]w[redacted]d.Estimated Date of Delivery: 04/16/22 Sex of baby and name:  " "   FOB:    Eli  Factors complicating this pregnancy  Di/Di twin pregnancy with IUGR  Referrals: MFM referral completed 10/24/2021- IUGR Anatomy US scheduled for 20 weeks with MFM 12/28/21 u/s by MFM 8% growth both Fetus A+B , repeat u/s mfm [x ]  02/08/22 Add ferrous sulfate 01/11/22 02/28/22- Baby A  1494 gm < 1%, Baby B 1656gm efw 2.2% Antepartum management:  Monthly [place] for growth/AFI in 3rd trimester  Weekly NST at 36 weeks  Delivery at 36-37 per MFM Delivery for fetal growth restriction in a dichorionic twin  gestation is usually recommended at around 36 to 37 weeks  as long as the fetal testing and umbilical artery Doppler  studies remain within normal limits.  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  Anxiety  NOB EPDS 10 Restarted Lexapro 10 MG  Screening results and needs: NOB:  Medicaid Questionnaire:  []  ACHD Program Depression Score: 10 MBT:  A+ Ab screen:   Neg HIV:  Neg RPR:  NR  Hep B: Neg Hep C: NR Pap: 10/18/2021 NILM  G/C: Neg/Neg Rubella:  Immune  VZV: Immune TSH: 3.271 HgA1C: 4.9 Aneuploidy:  First trimester:  MaternitT21:   declined Second trimester (AFP/tetra):  declined 28 weeks:  Review Medicaid Questionnaire: []  ACHD Program Depression Score:6 Blood consent:signed 01/25/22 bl Hgb: 11.1  Platelets: 299   Glucola: 89  Rhogam: n/a 36 weeks:  GBS:   G/C:   Hgb:  Platelets:    HIV: RPR:    Last :  09/19/2021: Di/di twin gestation; Uterus anteverted; Cervical length = 5.11 cm ; Right ovary appears WNL; Left ovary - 2 corpus luteum cyst 1) 1.8cm 2) 1.4cm; Fetus A: Single, viable IUP, S = [redacted]w[redacted]d; FHT = 180 bpm; Yolk sac and amnion imaged; Fetus B:  Single, viable IUP, S = [redacted]w[redacted]d; FHR = 180 bpm; Yolk sac and amnion seen  12/28/21: Baby A-breech, post plac, AFI wnl    Baby B-transverse, post plac, AFI wnl 02/08/22: Baby A-breech, post plac, AFI wnl, 02/27/22 g   Baby B-cephalic, post plac, AFI wnl EFW 1303  02/28/22: Baby A-breech, post plac, AFI wnl, EFW 1494 g  Baby B-cephalic,post place, AFI wnl EFW 1656 Immunization:   Flu in season - received in season  Tdap at 27-36 weeks - given 01/25/22 bl Covid-19 - received booster  Contraception Plan:  Feeding Plan:  Labor Plans:        OB History     Gravida  1   Para      Term      Preterm      AB      Living         SAB      IAB      Ectopic      Multiple      Live Births             Past Medical History:  Diagnosis Date   Asthma    Past Surgical History:  Procedure Laterality Date   NO PAST SURGERIES     Family History: family history includes Arthritis in her maternal grandmother; Diabetes in her mother; Hypertension in her  mother. Social History:  reports that she has never smoked. She does not have any smokeless tobacco history on file. She reports that she does not drink alcohol and does not use drugs.     Review of Systems. Review of Systems: A full review of systems was performed and negative except as noted in the HPI.   Eyes: no vision change  Ears: left ear pain  Oropharynx: no sore throat  Pulmonary . No shortness of breath , no hemoptysis Cardiovascular: no chest pain , no irregular heart beat  Gastrointestinal:no blood in stool . No diarrhea, no constipation Uro gynecologic: no dysuria , no pelvic pain Neurologic : no seizure , no migraines    Musculoskeletal: no muscular weakness  History   Last menstrual period 07/17/2021. Exam 03/06/22 Weight 214  BP 111/74 Physical Exam  Lungs CTA   CV RRR  Abd : gravid  Prenatal labs: ABO, Rh: A+  Antibody: neg   Rubella: IMM/ varicella Imm  RPR:NR    HBsAg: Neg   HIV: Neg   GBS: not  done yet     Assessment/Plan: IUGR  twin A+B Pt elects for primary LTCS  03/23/22 MFM following with BPP and Doppler studies until delivery date  Risks of the procedure discussed . All questions answered   The risks of cesarean section discussed with the patient included but were not limited to: bleeding which may require transfusion or reoperation; infection which may require antibiotics; injury to bowel, bladder, ureters or other surrounding organs; injury to the fetus; need for additional procedures including hysterectomy in the event of a life-threatening hemorrhage; placental abnormalities wth subsequent pregnancies, incisional problems, thromboembolic phenomenon and other postoperative/anesthesia complications. The patient concurred with the proposed plan, giving informed written consent for the procedure.   Preoperative prophylactic antibiotics and SCDs ordered on call to the OR.  To OR when ready.     Ihor Austin Rubie Ficco 03/06/2022, 2:19 PM

## 2022-03-08 ENCOUNTER — Other Ambulatory Visit: Payer: Self-pay | Admitting: *Deleted

## 2022-03-08 ENCOUNTER — Ambulatory Visit (HOSPITAL_BASED_OUTPATIENT_CLINIC_OR_DEPARTMENT_OTHER): Payer: BC Managed Care – PPO | Admitting: *Deleted

## 2022-03-08 ENCOUNTER — Other Ambulatory Visit: Payer: Self-pay | Admitting: Obstetrics and Gynecology

## 2022-03-08 ENCOUNTER — Ambulatory Visit: Payer: BC Managed Care – PPO | Admitting: *Deleted

## 2022-03-08 ENCOUNTER — Ambulatory Visit: Payer: BC Managed Care – PPO | Attending: Obstetrics and Gynecology

## 2022-03-08 VITALS — BP 109/69 | HR 84

## 2022-03-08 DIAGNOSIS — O365931 Maternal care for other known or suspected poor fetal growth, third trimester, fetus 1: Secondary | ICD-10-CM

## 2022-03-08 DIAGNOSIS — J45909 Unspecified asthma, uncomplicated: Secondary | ICD-10-CM

## 2022-03-08 DIAGNOSIS — O99343 Other mental disorders complicating pregnancy, third trimester: Secondary | ICD-10-CM

## 2022-03-08 DIAGNOSIS — O30043 Twin pregnancy, dichorionic/diamniotic, third trimester: Secondary | ICD-10-CM

## 2022-03-08 DIAGNOSIS — F419 Anxiety disorder, unspecified: Secondary | ICD-10-CM

## 2022-03-08 DIAGNOSIS — O99513 Diseases of the respiratory system complicating pregnancy, third trimester: Secondary | ICD-10-CM

## 2022-03-08 DIAGNOSIS — O365930 Maternal care for other known or suspected poor fetal growth, third trimester, not applicable or unspecified: Secondary | ICD-10-CM

## 2022-03-08 DIAGNOSIS — O365932 Maternal care for other known or suspected poor fetal growth, third trimester, fetus 2: Secondary | ICD-10-CM

## 2022-03-08 DIAGNOSIS — Z3A34 34 weeks gestation of pregnancy: Secondary | ICD-10-CM

## 2022-03-08 NOTE — Procedures (Signed)
Megan Diaz 2001/07/07 [redacted]w[redacted]d   Fetus B Non-Stress Test Interpretation for 03/08/22  Indication: IUGR  Fetal Heart Rate Fetus B Mode: External, Doppler Baseline Rate (B): 140 BPM Variability: Moderate Accelerations: 15 x 15 Decelerations: None  Uterine Activity Mode: Palpation, Toco Contraction Frequency (min): none Resting Tone Palpated: Relaxed  Interpretation (Baby B - Fetal Testing) Nonstress Test Interpretation (Baby B): Reactive Overall Impression (Baby B): Reassuring for gestational age Comments (Baby B): Dr. Grace Bushy reviewed tracing  Megan Diaz [birth year] [redacted]w[redacted]d  Fetus A Non-Stress Test Interpretation for 03/08/22  Indication: IUGR  Fetal Heart Rate A Mode: External Baseline Rate (A): 135 bpm Variability: Moderate Accelerations: 15 x 15 Decelerations: None Multiple birth?: Yes  Uterine Activity Mode: Palpation, Toco Contraction Frequency (min): none Resting Tone Palpated: Relaxed  Interpretation (Fetal Testing) Nonstress Test Interpretation: Reactive Overall Impression: Reassuring for gestational age Comments: Dr. Grace Bushy reviewed tracing

## 2022-03-12 ENCOUNTER — Ambulatory Visit: Payer: BC Managed Care – PPO | Attending: Maternal & Fetal Medicine | Admitting: *Deleted

## 2022-03-12 ENCOUNTER — Encounter: Payer: Self-pay | Admitting: *Deleted

## 2022-03-12 ENCOUNTER — Ambulatory Visit: Payer: BC Managed Care – PPO | Admitting: *Deleted

## 2022-03-12 ENCOUNTER — Other Ambulatory Visit: Payer: Self-pay | Admitting: Obstetrics and Gynecology

## 2022-03-12 ENCOUNTER — Ambulatory Visit (HOSPITAL_BASED_OUTPATIENT_CLINIC_OR_DEPARTMENT_OTHER): Payer: BC Managed Care – PPO

## 2022-03-12 VITALS — BP 128/76 | HR 93

## 2022-03-12 DIAGNOSIS — O365931 Maternal care for other known or suspected poor fetal growth, third trimester, fetus 1: Secondary | ICD-10-CM

## 2022-03-12 DIAGNOSIS — O365922 Maternal care for other known or suspected poor fetal growth, second trimester, fetus 2: Secondary | ICD-10-CM

## 2022-03-12 DIAGNOSIS — O99513 Diseases of the respiratory system complicating pregnancy, third trimester: Secondary | ICD-10-CM | POA: Diagnosis not present

## 2022-03-12 DIAGNOSIS — O365932 Maternal care for other known or suspected poor fetal growth, third trimester, fetus 2: Secondary | ICD-10-CM | POA: Insufficient documentation

## 2022-03-12 DIAGNOSIS — O99343 Other mental disorders complicating pregnancy, third trimester: Secondary | ICD-10-CM

## 2022-03-12 DIAGNOSIS — Z3A35 35 weeks gestation of pregnancy: Secondary | ICD-10-CM | POA: Diagnosis not present

## 2022-03-12 DIAGNOSIS — O30043 Twin pregnancy, dichorionic/diamniotic, third trimester: Secondary | ICD-10-CM

## 2022-03-12 DIAGNOSIS — J45909 Unspecified asthma, uncomplicated: Secondary | ICD-10-CM | POA: Insufficient documentation

## 2022-03-12 DIAGNOSIS — F419 Anxiety disorder, unspecified: Secondary | ICD-10-CM | POA: Diagnosis not present

## 2022-03-12 DIAGNOSIS — O368131 Decreased fetal movements, third trimester, fetus 1: Secondary | ICD-10-CM

## 2022-03-12 DIAGNOSIS — O289 Unspecified abnormal findings on antenatal screening of mother: Secondary | ICD-10-CM | POA: Diagnosis not present

## 2022-03-12 DIAGNOSIS — O288 Other abnormal findings on antenatal screening of mother: Secondary | ICD-10-CM

## 2022-03-12 NOTE — Procedures (Signed)
Megan Diaz 2000/10/13 [redacted]w[redacted]d   Fetus B Non-Stress Test Interpretation for 03/12/22  Indication: IUGR  Fetal Heart Rate Fetus B Mode: External Baseline Rate (B): 145 BPM Variability: Moderate Accelerations: 15 x 15 Decelerations: None  Uterine Activity Mode: Palpation, Toco Contraction Frequency (min): none Resting Tone Palpated: Relaxed  Interpretation (Baby B - Fetal Testing) Nonstress Test Interpretation (Baby B): Reactive Overall Impression (Baby B): Reassuring for gestational age Comments (Baby B): Dr. Judeth Cornfield reviewed tracing  Megan Diaz [birth year] [redacted]w[redacted]d  Fetus A Non-Stress Test Interpretation for 03/12/22  Indication: IUGR  Fetal Heart Rate A Mode: External Baseline Rate (A): 140 bpm Variability: Moderate Accelerations: None (had some 10x10's but no 15x15's-did not meet criteria for reactive NST) Decelerations: None Multiple birth?: Yes  Uterine Activity Mode: Palpation, Toco Contraction Frequency (min): none Resting Tone Palpated: Relaxed  Interpretation (Fetal Testing) Nonstress Test Interpretation: Non-reactive Overall Impression: Reassuring for gestational age Comments: Dr. Judeth Cornfield reviewed tracing-patient to [place] for BPP

## 2022-03-13 ENCOUNTER — Encounter: Payer: Self-pay | Admitting: Obstetrics and Gynecology

## 2022-03-13 ENCOUNTER — Observation Stay
Admission: EM | Admit: 2022-03-13 | Discharge: 2022-03-13 | Disposition: A | Discharge status: Home / Self Care | Payer: BC Managed Care – PPO | Attending: Obstetrics and Gynecology | Admitting: Obstetrics and Gynecology

## 2022-03-13 DIAGNOSIS — J45909 Unspecified asthma, uncomplicated: Secondary | ICD-10-CM | POA: Insufficient documentation

## 2022-03-13 DIAGNOSIS — O365930 Maternal care for other known or suspected poor fetal growth, third trimester, not applicable or unspecified: Secondary | ICD-10-CM | POA: Diagnosis not present

## 2022-03-13 DIAGNOSIS — O99513 Diseases of the respiratory system complicating pregnancy, third trimester: Secondary | ICD-10-CM | POA: Insufficient documentation

## 2022-03-13 DIAGNOSIS — Z98891 History of uterine scar from previous surgery: Secondary | ICD-10-CM | POA: Diagnosis not present

## 2022-03-13 DIAGNOSIS — Z3689 Encounter for other specified antenatal screening: Secondary | ICD-10-CM | POA: Diagnosis present

## 2022-03-13 DIAGNOSIS — Z3A35 35 weeks gestation of pregnancy: Secondary | ICD-10-CM | POA: Insufficient documentation

## 2022-03-13 DIAGNOSIS — O30043 Twin pregnancy, dichorionic/diamniotic, third trimester: Secondary | ICD-10-CM | POA: Diagnosis not present

## 2022-03-13 DIAGNOSIS — Z7982 Long term (current) use of aspirin: Secondary | ICD-10-CM | POA: Diagnosis not present

## 2022-03-13 DIAGNOSIS — Z79899 Other long term (current) drug therapy: Secondary | ICD-10-CM | POA: Insufficient documentation

## 2022-03-13 MED ORDER — BETAMETHASONE SOD PHOS & ACET 6 (3-3) MG/ML IJ SUSP
12.0000 mg | INTRAMUSCULAR | Status: DC
  Filled 2022-03-13: qty 5

## 2022-03-13 NOTE — Discharge Summary (Signed)
Megan Diaz is a 21 y.o. female. She is at [redacted]w[redacted]d gestation. Patient's last menstrual period was 07/17/2021. Estimated Date of Delivery: 04/16/22  Prenatal care site: [facility]   Current pregnancy complicated by:  Factors complicating this pregnancy  Di/Di twin pregnancy with IUGR  Referrals: MFM referral completed 10/24/2021- IUGR Anatomy US scheduled for 20 weeks with MFM 12/28/21 u/s by MFM 8% growth both Fetus A+B , repeat u/s mfm [x ]  02/08/22 Add ferrous sulfate 01/11/22 02/28/22- Baby A  1494 gm < 1%, Baby B 1656gm efw 2.2% Primary LTCS with TJS  Anxiety  NOB EPDS 10 Restarted Lexapro 10 MG  Chief complaint: scheduled NST   S: Resting comfortably. no CTX, no VB.no LOF,  Active fetal movement. Denies: HA, visual changes, SOB, or RUQ/epigastric pain  Maternal Medical History:   Past Medical History:  Diagnosis Date   Asthma     Past Surgical History:  Procedure Laterality Date   NO PAST SURGERIES      Allergies  Allergen Reactions   Cefzil [Cefprozil]     Prior to Admission medications   Medication Sig Start Date End Date Taking? Authorizing Provider  aspirin EC 81 MG tablet Take 81 mg by mouth daily. Swallow whole.    [provider]  escitalopram (LEXAPRO) 10 MG tablet Take 1 tablet by mouth daily. 05/08/21   [provider]  ferrous sulfate 325 (65 FE) MG tablet Take 325 mg by mouth daily with breakfast.    [provider]  Prenatal Vit-Fe Fumarate-FA (PRENATAL MULTIVITAMIN) TABS tablet Take 1 tablet by mouth daily at 12 noon.    [provider]      Social History: She  reports that she has never smoked. She does not have any smokeless tobacco history on file. She reports that she does not drink alcohol and does not use drugs.  Family History: family history includes Arthritis in her maternal grandmother; Asthma in her mother; Diabetes in her mother; Hypertension in her mother.   Review of Systems: A full  review of systems was performed and negative except as noted in the HPI.     O:  BP 120/78   Pulse 87   Temp 97.8 F (36.6 C) (Oral)   Resp 16   LMP 07/17/2021  No results found for this or any previous visit (from the past 48 hour(s)).   Constitutional: NAD, AAOx3  HE/ENT: extraocular movements grossly intact, moist mucous membranes CV: RRR PULM: nl respiratory effort, CTABL     Abd: gravid, non-tender, non-distended, soft      Ext: Non-tender, Nonedematous   Psych: mood appropriate, speech normal Pelvic: deferred  Baby A  Fetal  monitoring: Cat I Appropriate for GA Baseline: 140 Variability: moderate Accelerations: present x >2 Decelerations absent  Baby B  Fetal  monitoring: Cat I Appropriate for GA Baseline: 130 Variability: moderate Accelerations: present x >2 Decelerations absent  Time   A/P: 21 y.o. [redacted]w[redacted]d here for antenatal surveillance for twin IUGR  Principle Diagnosis:  High risk pregnancy in third trimester  Notified Dr Judeth Cornfield, MFM attending of reactive NST. Pt has f/u with MFM on 7/27 Fetal Wellbeing: Reassuring Cat 1 tracing; Reactive NST x 2 D/c home stable, precautions reviewed, follow-up as scheduled.    Megan Diaz, CNM 03/13/2022  4:35 PM

## 2022-03-14 ENCOUNTER — Encounter
Admission: RE | Admit: 2022-03-14 | Discharge: 2022-03-14 | Disposition: A | Discharge status: Home / Self Care | Payer: BC Managed Care – PPO | Source: Ambulatory Visit | Attending: Obstetrics and Gynecology | Admitting: Obstetrics and Gynecology

## 2022-03-14 HISTORY — DX: Gastro-esophageal reflux disease without esophagitis: K21.9

## 2022-03-14 HISTORY — DX: Maternal care for other known or suspected poor fetal growth, unspecified trimester, not applicable or unspecified: O36.5990

## 2022-03-14 HISTORY — DX: Anemia, unspecified: D64.9

## 2022-03-14 HISTORY — DX: Anxiety disorder, unspecified: F41.9

## 2022-03-14 NOTE — Patient Instructions (Addendum)
Your procedure is scheduled on:03-19-22 Monday Report to the Registration Desk on the 1st floor of the [facility]. Then proceed to the 3rd floor Labor and Delivery. Arrive at 9:45 am  REMEMBER: Instructions that are not followed completely may result in serious medical risk, up to and including death; or upon the discretion of your surgeon and anesthesiologist your surgery may need to be rescheduled.  Do not eat food OR drink any liquids after midnight the night before surgery.  No gum chewing, lozengers or hard candies.  Do NOT take any medication the day of surgery  Call Dr Willa Rough office today (03-14-22) about when to stop your 81 mg Asprin  One week prior to surgery: Stop Anti-inflammatories (NSAIDS) such as Advil, Aleve, Ibuprofen, Motrin, Naproxen, Naprosyn and Aspirin based products such as Excedrin, Goodys Powder, BC Powder. You may however, continue to take Tylenol if needed for pain up until the day of surgery.  No Alcohol for 24 hours before or after surgery.  No Smoking including e-cigarettes for 24 hours prior to surgery.  No chewable tobacco products for at least 6 hours prior to surgery.  No nicotine patches on the day of surgery.  Do not use any "recreational" drugs for at least a week prior to your surgery.  Please be advised that the combination of cocaine and anesthesia may have negative outcomes, up to and including death. If you test positive for cocaine, your surgery will be cancelled.  On the morning of surgery brush your teeth with toothpaste and water, you may rinse your mouth with mouthwash if you wish. Do not swallow any toothpaste or mouthwash.  Use CHG Soap as directed on instruction sheet-Avoid nipple and private area  Do not wear jewelry, make-up, hairpins, clips or nail polish.  Do not wear lotions, powders, or perfumes.   Do not shave body from the neck down 48 hours prior to surgery just in case you cut yourself which could leave a site for  infection.  Also, freshly shaved skin may become irritated if using the CHG soap.  Contact lenses, hearing aids and dentures may not be worn into surgery.  Do not bring valuables to the hospital. [facility] is not responsible for any missing/lost belongings or valuables.   Notify your doctor if there is any change in your medical condition (cold, fever, infection).  Wear comfortable clothing (specific to your surgery type) to the hospital.  After surgery, you can help prevent lung complications by doing breathing exercises.  Take deep breaths and cough every 1-2 hours. Your doctor may order a device called an Incentive Spirometer to help you take deep breaths. When coughing or sneezing, hold a pillow firmly against your incision with both hands. This is called "splinting." Doing this helps protect your incision. It also decreases belly discomfort.  If you are being admitted to the hospital overnight, leave your suitcase in the car. After surgery it may be brought to your room.  If you are being discharged the day of surgery, you will not be allowed to drive home. You will need a responsible adult (18 years or older) to drive you home and stay with you that night.   If you are taking public transportation, you will need to have a responsible adult (18 years or older) with you. Please confirm with your physician that it is acceptable to use public transportation.   Please call the Pre-admissions Testing Dept. at [phone] if you have any questions about these instructions.  Surgery Visitation Policy:  Patients undergoing a surgery or procedure may have two family members or support persons with them as long as the person is not COVID-19 positive or experiencing its symptoms.   Inpatient Visitation:    Visiting hours are 7 a.m. to 8 p.m. Up to four visitors are allowed at one time in a patient room, including children. The visitors may rotate out with other people during the day.  One designated support person (adult) may remain overnight.

## 2022-03-15 ENCOUNTER — Ambulatory Visit: Payer: BC Managed Care – PPO | Admitting: *Deleted

## 2022-03-15 ENCOUNTER — Ambulatory Visit: Payer: BC Managed Care – PPO | Attending: Maternal & Fetal Medicine

## 2022-03-15 VITALS — BP 122/82 | HR 82

## 2022-03-15 DIAGNOSIS — O368131 Decreased fetal movements, third trimester, fetus 1: Secondary | ICD-10-CM | POA: Diagnosis not present

## 2022-03-15 DIAGNOSIS — O365932 Maternal care for other known or suspected poor fetal growth, third trimester, fetus 2: Secondary | ICD-10-CM | POA: Diagnosis not present

## 2022-03-15 DIAGNOSIS — O365931 Maternal care for other known or suspected poor fetal growth, third trimester, fetus 1: Secondary | ICD-10-CM

## 2022-03-15 DIAGNOSIS — O365922 Maternal care for other known or suspected poor fetal growth, second trimester, fetus 2: Secondary | ICD-10-CM | POA: Diagnosis present

## 2022-03-15 DIAGNOSIS — O99343 Other mental disorders complicating pregnancy, third trimester: Secondary | ICD-10-CM | POA: Diagnosis not present

## 2022-03-15 DIAGNOSIS — O30043 Twin pregnancy, dichorionic/diamniotic, third trimester: Secondary | ICD-10-CM | POA: Diagnosis not present

## 2022-03-15 DIAGNOSIS — F419 Anxiety disorder, unspecified: Secondary | ICD-10-CM

## 2022-03-15 DIAGNOSIS — J45909 Unspecified asthma, uncomplicated: Secondary | ICD-10-CM

## 2022-03-15 DIAGNOSIS — Z3A35 35 weeks gestation of pregnancy: Secondary | ICD-10-CM

## 2022-03-15 DIAGNOSIS — O99513 Diseases of the respiratory system complicating pregnancy, third trimester: Secondary | ICD-10-CM

## 2022-03-15 NOTE — Procedures (Signed)
ARDRA KUZNICKI Sep 12, 2000 [redacted]w[redacted]d   Fetus B Non-Stress Test Interpretation for 03/15/22  Indication: IUGR  Fetal Heart Rate Fetus B Mode: External Baseline Rate (B): 135 BPM Variability: Moderate Accelerations: 15 x 15 Decelerations: None  Uterine Activity Mode: Palpation, Toco Contraction Frequency (min): none Resting Tone Palpated: Relaxed  Interpretation (Baby B - Fetal Testing) Nonstress Test Interpretation (Baby B): Reactive Overall Impression (Baby B): Reassuring for gestational age Comments (Baby B): Dr. Judeth Cornfield reviewed tracing  LAKEITHIA RASOR September 29, 2000 [redacted]w[redacted]d  Fetus A Non-Stress Test Interpretation for 03/15/22  Indication: IUGR  Fetal Heart Rate A Mode: External Baseline Rate (A): 140 bpm Variability: Moderate Accelerations: 15 x 15 Decelerations: None Multiple birth?: Yes  Uterine Activity Mode: Palpation, Toco Contraction Frequency (min): none Resting Tone Palpated: Relaxed  Interpretation (Fetal Testing) Nonstress Test Interpretation: Reactive Overall Impression: Reassuring for gestational age Comments: Dr. Judeth Cornfield reviewed tracing

## 2022-03-16 ENCOUNTER — Encounter
Admission: RE | Admit: 2022-03-16 | Discharge: 2022-03-16 | Disposition: A | Discharge status: Home / Self Care | Payer: BC Managed Care – PPO | Source: Ambulatory Visit | Attending: Obstetrics and Gynecology | Admitting: Obstetrics and Gynecology

## 2022-03-16 DIAGNOSIS — Z01812 Encounter for preprocedural laboratory examination: Secondary | ICD-10-CM | POA: Insufficient documentation

## 2022-03-16 LAB — BASIC METABOLIC PANEL
Anion gap: 6 (ref 5–15)
BUN: 9 mg/dL (ref 6–20)
CO2: 23 mmol/L (ref 22–32)
Calcium: 8.4 mg/dL — ABNORMAL LOW (ref 8.9–10.3)
Chloride: 110 mmol/L (ref 98–111)
Creatinine, Ser: 0.73 mg/dL (ref 0.44–1.00)
GFR, Estimated: >60 mL/min (ref >60)
Glucose, Bld: 78 mg/dL (ref 70–99)
Potassium: 4.5 mmol/L (ref 3.5–5.1)
Sodium: 139 mmol/L (ref 135–145)

## 2022-03-16 LAB — CBC
HCT: 32.2 % — ABNORMAL LOW (ref 36.0–46.0)
Hemoglobin: 10.7 g/dL — ABNORMAL LOW (ref 12.0–15.0)
MCH: 27.9 pg (ref 26.0–34.0)
MCHC: 33.2 g/dL (ref 30.0–36.0)
MCV: 83.9 fL (ref 80.0–100.0)
Platelets: 280 10*3/uL (ref 150–400)
RBC: 3.84 MIL/uL — ABNORMAL LOW (ref 3.87–5.11)
RDW: 12.8 % (ref 11.5–15.5)
WBC: 7.8 10*3/uL (ref 4.0–10.5)
nRBC: 0 % (ref 0.0–0.2)

## 2022-03-16 LAB — TYPE AND SCREEN
ABO/RH(D): A POS
Antibody Screen: NEGATIVE
Extend sample reason: UNDETERMINED

## 2022-03-19 ENCOUNTER — Inpatient Hospital Stay
Admission: RE | Admit: 2022-03-19 | Discharge: 2022-03-21 | DRG: 787 | Disposition: A | Discharge status: Home / Self Care | Payer: BC Managed Care – PPO | Source: Ambulatory Visit | Attending: Obstetrics and Gynecology | Admitting: Obstetrics and Gynecology

## 2022-03-19 ENCOUNTER — Encounter
Admission: RE | Disposition: A | Discharge status: Home / Self Care | Payer: Self-pay | Source: Ambulatory Visit | Attending: Obstetrics and Gynecology

## 2022-03-19 ENCOUNTER — Other Ambulatory Visit: Payer: Self-pay

## 2022-03-19 ENCOUNTER — Encounter: Payer: Self-pay | Admitting: Obstetrics and Gynecology

## 2022-03-19 ENCOUNTER — Inpatient Hospital Stay: Payer: BC Managed Care – PPO | Admitting: Urgent Care

## 2022-03-19 ENCOUNTER — Inpatient Hospital Stay: Payer: BC Managed Care – PPO | Admitting: Anesthesiology

## 2022-03-19 DIAGNOSIS — Z3A36 36 weeks gestation of pregnancy: Secondary | ICD-10-CM | POA: Diagnosis not present

## 2022-03-19 DIAGNOSIS — D62 Acute posthemorrhagic anemia: Secondary | ICD-10-CM

## 2022-03-19 DIAGNOSIS — O9081 Anemia of the puerperium: Secondary | ICD-10-CM | POA: Diagnosis not present

## 2022-03-19 DIAGNOSIS — O30003 Twin pregnancy, unspecified number of placenta and unspecified number of amniotic sacs, third trimester: Secondary | ICD-10-CM | POA: Diagnosis present

## 2022-03-19 DIAGNOSIS — O321XX1 Maternal care for breech presentation, fetus 1: Secondary | ICD-10-CM | POA: Diagnosis present

## 2022-03-19 DIAGNOSIS — O30043 Twin pregnancy, dichorionic/diamniotic, third trimester: Secondary | ICD-10-CM | POA: Diagnosis present

## 2022-03-19 DIAGNOSIS — O365932 Maternal care for other known or suspected poor fetal growth, third trimester, fetus 2: Secondary | ICD-10-CM | POA: Diagnosis present

## 2022-03-19 DIAGNOSIS — O365990 Maternal care for other known or suspected poor fetal growth, unspecified trimester, not applicable or unspecified: Principal | ICD-10-CM | POA: Diagnosis present

## 2022-03-19 DIAGNOSIS — O365931 Maternal care for other known or suspected poor fetal growth, third trimester, fetus 1: Secondary | ICD-10-CM | POA: Diagnosis present

## 2022-03-19 LAB — ABO/RH: ABO/RH(D): A POS

## 2022-03-19 SURGERY — Surgical Case
Anesthesia: Spinal

## 2022-03-19 MED ORDER — MORPHINE SULFATE (PF) 0.5 MG/ML IJ SOLN
INTRAMUSCULAR | Status: DC | PRN
  Administered 2022-03-19: 200 ug via EPIDURAL

## 2022-03-19 MED ORDER — COCONUT OIL OIL: 1.0000 | TOPICAL_OIL | Status: DC | PRN

## 2022-03-19 MED ORDER — OXYTOCIN-SODIUM CHLORIDE 30-0.9 UT/500ML-% IV SOLN: 2.5000 [IU]/h | INTRAVENOUS | Status: AC

## 2022-03-19 MED ORDER — ACETAMINOPHEN 500 MG PO TABS
ORAL_TABLET | ORAL | Status: AC
  Filled 2022-03-19: qty 2

## 2022-03-19 MED ORDER — MEPERIDINE HCL 25 MG/ML IJ SOLN: 6.2500 mg | INTRAMUSCULAR | Status: DC | PRN

## 2022-03-19 MED ORDER — KETOROLAC TROMETHAMINE 30 MG/ML IJ SOLN
30.0000 mg | Freq: Four times a day (QID) | INTRAMUSCULAR | Status: AC | PRN
  Administered 2022-03-19: 30 mg via INTRAVENOUS

## 2022-03-19 MED ORDER — DEXTROSE 5 % IV SOLN
INTRAVENOUS | Status: DC | PRN
  Administered 2022-03-19: 100 mL via INTRAVENOUS

## 2022-03-19 MED ORDER — ESCITALOPRAM OXALATE 10 MG PO TABS
10.0000 mg | ORAL_TABLET | Freq: Every day | ORAL | Status: DC
  Administered 2022-03-19 – 2022-03-20 (×2): 10 mg via ORAL
  Filled 2022-03-19 (×2): qty 1

## 2022-03-19 MED ORDER — OXYTOCIN-SODIUM CHLORIDE 30-0.9 UT/500ML-% IV SOLN
INTRAVENOUS | Status: DC | PRN
  Administered 2022-03-19: 250 mL/h via INTRAVENOUS

## 2022-03-19 MED ORDER — LACTATED RINGERS IV SOLN: INTRAVENOUS | Status: DC | PRN

## 2022-03-19 MED ORDER — DIPHENHYDRAMINE HCL 25 MG PO CAPS: 25.0000 mg | ORAL_CAPSULE | ORAL | Status: DC | PRN

## 2022-03-19 MED ORDER — ACETAMINOPHEN 500 MG PO TABS
1000.0000 mg | ORAL_TABLET | Freq: Four times a day (QID) | ORAL | Status: DC
Start: 2022-03-19 — End: 2022-03-21
  Administered 2022-03-19 – 2022-03-21 (×7): 1000 mg via ORAL
  Filled 2022-03-19 (×7): qty 2

## 2022-03-19 MED ORDER — DIBUCAINE (PERIANAL) 1 % EX OINT: 1.0000 | TOPICAL_OINTMENT | CUTANEOUS | Status: DC | PRN

## 2022-03-19 MED ORDER — ONDANSETRON HCL 4 MG/2ML IJ SOLN
INTRAMUSCULAR | Status: DC | PRN
  Administered 2022-03-19: 4 mg via INTRAVENOUS

## 2022-03-19 MED ORDER — KETOROLAC TROMETHAMINE 30 MG/ML IJ SOLN: 30.0000 mg | Freq: Four times a day (QID) | INTRAMUSCULAR | Status: AC | PRN

## 2022-03-19 MED ORDER — TETANUS-DIPHTH-ACELL PERTUSSIS 5-2.5-18.5 LF-MCG/0.5 IM SUSY: 0.5000 mL | PREFILLED_SYRINGE | Freq: Once | INTRAMUSCULAR | Status: DC

## 2022-03-19 MED ORDER — BUPIVACAINE IN DEXTROSE 0.75-8.25 % IT SOLN
INTRATHECAL | Status: DC | PRN
  Administered 2022-03-19: 1.4 mL via INTRATHECAL

## 2022-03-19 MED ORDER — PHENYLEPHRINE 80 MCG/ML (10ML) SYRINGE FOR IV PUSH (FOR BLOOD PRESSURE SUPPORT)
PREFILLED_SYRINGE | INTRAVENOUS | Status: DC | PRN
  Administered 2022-03-19: 160 ug via INTRAVENOUS

## 2022-03-19 MED ORDER — METHYLERGONOVINE MALEATE 0.2 MG/ML IJ SOLN
INTRAMUSCULAR | Status: AC
  Filled 2022-03-19: qty 1

## 2022-03-19 MED ORDER — OXYCODONE HCL 5 MG PO TABS
5.0000 mg | ORAL_TABLET | ORAL | Status: DC | PRN
  Administered 2022-03-20: 10 mg via ORAL
  Administered 2022-03-20 (×2): 5 mg via ORAL
  Administered 2022-03-21 (×3): 10 mg via ORAL
  Filled 2022-03-19: qty 1
  Filled 2022-03-19 (×4): qty 2

## 2022-03-19 MED ORDER — SODIUM CHLORIDE (PF) 0.9 % IJ SOLN
INTRAMUSCULAR | Status: AC
  Filled 2022-03-19: qty 50

## 2022-03-19 MED ORDER — OXYCODONE HCL 5 MG PO TABS
5.0000 mg | ORAL_TABLET | Freq: Four times a day (QID) | ORAL | Status: DC | PRN
  Filled 2022-03-19: qty 1

## 2022-03-19 MED ORDER — SOD CITRATE-CITRIC ACID 500-334 MG/5ML PO SOLN: 30.0000 mL | ORAL | Status: AC

## 2022-03-19 MED ORDER — KETOROLAC TROMETHAMINE 30 MG/ML IJ SOLN
30.0000 mg | Freq: Four times a day (QID) | INTRAMUSCULAR | Status: AC
  Administered 2022-03-19 – 2022-03-20 (×3): 30 mg via INTRAVENOUS
  Filled 2022-03-19 (×4): qty 1

## 2022-03-19 MED ORDER — WITCH HAZEL-GLYCERIN EX PADS: 1.0000 | MEDICATED_PAD | CUTANEOUS | Status: DC | PRN

## 2022-03-19 MED ORDER — MORPHINE SULFATE (PF) 2 MG/ML IV SOLN: 1.0000 mg | INTRAVENOUS | Status: DC | PRN

## 2022-03-19 MED ORDER — PHENYLEPHRINE HCL-NACL 20-0.9 MG/250ML-% IV SOLN
INTRAVENOUS | Status: DC | PRN
  Administered 2022-03-19: 40 ug/min via INTRAVENOUS

## 2022-03-19 MED ORDER — METOCLOPRAMIDE HCL 5 MG/ML IJ SOLN
INTRAMUSCULAR | Status: DC | PRN
  Administered 2022-03-19 (×2): 5 mg via INTRAVENOUS

## 2022-03-19 MED ORDER — ZOLPIDEM TARTRATE 5 MG PO TABS: 5.0000 mg | ORAL_TABLET | Freq: Every evening | ORAL | Status: DC | PRN

## 2022-03-19 MED ORDER — SODIUM CHLORIDE 0.9% FLUSH: 3.0000 mL | INTRAVENOUS | Status: DC | PRN

## 2022-03-19 MED ORDER — IBUPROFEN 600 MG PO TABS
600.0000 mg | ORAL_TABLET | Freq: Four times a day (QID) | ORAL | Status: DC
  Administered 2022-03-20 – 2022-03-21 (×5): 600 mg via ORAL
  Filled 2022-03-19 (×5): qty 1

## 2022-03-19 MED ORDER — FENTANYL CITRATE (PF) 100 MCG/2ML IJ SOLN: 25.0000 ug | INTRAMUSCULAR | Status: DC | PRN

## 2022-03-19 MED ORDER — PRENATAL MULTIVITAMIN CH
1.0000 | ORAL_TABLET | Freq: Every day | ORAL | Status: DC
  Administered 2022-03-19 – 2022-03-21 (×3): 1 via ORAL
  Filled 2022-03-19 (×3): qty 1

## 2022-03-19 MED ORDER — DEXAMETHASONE SODIUM PHOSPHATE 10 MG/ML IJ SOLN
INTRAMUSCULAR | Status: DC | PRN
  Administered 2022-03-19: 10 mg via INTRAVENOUS

## 2022-03-19 MED ORDER — SENNOSIDES-DOCUSATE SODIUM 8.6-50 MG PO TABS
2.0000 | ORAL_TABLET | Freq: Every day | ORAL | Status: DC
  Administered 2022-03-20 – 2022-03-21 (×2): 2 via ORAL
  Filled 2022-03-19 (×2): qty 2

## 2022-03-19 MED ORDER — SOD CITRATE-CITRIC ACID 500-334 MG/5ML PO SOLN
ORAL | Status: AC
  Administered 2022-03-19: 30 mL via ORAL
  Filled 2022-03-19: qty 15

## 2022-03-19 MED ORDER — VANCOMYCIN HCL IN DEXTROSE 1-5 GM/200ML-% IV SOLN
1000.0000 mg | Freq: Once | INTRAVENOUS | Status: AC
  Administered 2022-03-19: 1000 mg via INTRAVENOUS
  Filled 2022-03-19: qty 200

## 2022-03-19 MED ORDER — METHYLERGONOVINE MALEATE 0.2 MG/ML IJ SOLN
INTRAMUSCULAR | Status: DC | PRN
  Administered 2022-03-19: .2 mg via INTRAMUSCULAR

## 2022-03-19 MED ORDER — ENOXAPARIN SODIUM 40 MG/0.4ML IJ SOSY
40.0000 mg | PREFILLED_SYRINGE | INTRAMUSCULAR | Status: DC
  Administered 2022-03-20 – 2022-03-21 (×2): 40 mg via SUBCUTANEOUS
  Filled 2022-03-19 (×2): qty 0.4

## 2022-03-19 MED ORDER — MORPHINE SULFATE (PF) 0.5 MG/ML IJ SOLN
INTRAMUSCULAR | Status: AC
  Filled 2022-03-19: qty 10

## 2022-03-19 MED ORDER — ONDANSETRON HCL 4 MG/2ML IJ SOLN: 4.0000 mg | Freq: Three times a day (TID) | INTRAMUSCULAR | Status: DC | PRN

## 2022-03-19 MED ORDER — DIPHENHYDRAMINE HCL 25 MG PO CAPS: 25.0000 mg | ORAL_CAPSULE | Freq: Four times a day (QID) | ORAL | Status: DC | PRN

## 2022-03-19 MED ORDER — BUPIVACAINE HCL (PF) 0.5 % IJ SOLN
INTRAMUSCULAR | Status: AC
  Filled 2022-03-19: qty 60

## 2022-03-19 MED ORDER — LACTATED RINGERS IV SOLN: Freq: Once | INTRAVENOUS | Status: AC

## 2022-03-19 MED ORDER — GABAPENTIN 300 MG PO CAPS
300.0000 mg | ORAL_CAPSULE | Freq: Every day | ORAL | Status: DC
  Administered 2022-03-19 – 2022-03-20 (×2): 300 mg via ORAL
  Filled 2022-03-19 (×2): qty 1

## 2022-03-19 MED ORDER — NALOXONE HCL 0.4 MG/ML IJ SOLN: 0.4000 mg | INTRAMUSCULAR | Status: DC | PRN

## 2022-03-19 MED ORDER — SIMETHICONE 80 MG PO CHEW
80.0000 mg | CHEWABLE_TABLET | ORAL | Status: DC | PRN
  Administered 2022-03-20: 80 mg via ORAL
  Filled 2022-03-19: qty 1

## 2022-03-19 MED ORDER — LIDOCAINE HCL (PF) 1 % IJ SOLN
INTRAMUSCULAR | Status: DC | PRN
  Administered 2022-03-19: 5 mL via SUBCUTANEOUS

## 2022-03-19 MED ORDER — GABAPENTIN 300 MG PO CAPS: 300.0000 mg | ORAL_CAPSULE | Freq: Once | ORAL | Status: DC

## 2022-03-19 MED ORDER — PROMETHAZINE HCL 25 MG/ML IJ SOLN: 6.2500 mg | INTRAMUSCULAR | Status: DC | PRN

## 2022-03-19 MED ORDER — GENTAMICIN SULFATE 40 MG/ML IJ SOLN
5.0000 mg/kg | INTRAVENOUS | Status: AC
  Administered 2022-03-19: 350 mg via INTRAVENOUS
  Filled 2022-03-19: qty 8.75

## 2022-03-19 MED ORDER — DIPHENHYDRAMINE HCL 50 MG/ML IJ SOLN: 12.5000 mg | INTRAMUSCULAR | Status: DC | PRN

## 2022-03-19 MED ORDER — FAMOTIDINE 20 MG PO TABS
20.0000 mg | ORAL_TABLET | Freq: Once | ORAL | Status: AC
  Administered 2022-03-19: 20 mg via ORAL
  Filled 2022-03-19: qty 1

## 2022-03-19 MED ORDER — EPHEDRINE SULFATE (PRESSORS) 50 MG/ML IJ SOLN
INTRAMUSCULAR | Status: AC
  Filled 2022-03-19: qty 1

## 2022-03-19 MED ORDER — ACETAMINOPHEN 500 MG PO TABS: 1000.0000 mg | ORAL_TABLET | Freq: Once | ORAL | Status: DC

## 2022-03-19 MED ORDER — GABAPENTIN 300 MG PO CAPS
ORAL_CAPSULE | ORAL | Status: AC
  Filled 2022-03-19: qty 1

## 2022-03-19 MED ORDER — EPHEDRINE SULFATE (PRESSORS) 50 MG/ML IJ SOLN
INTRAMUSCULAR | Status: DC | PRN
  Administered 2022-03-19: 10 mg via INTRAVENOUS

## 2022-03-19 MED ORDER — SODIUM CHLORIDE 0.9% FLUSH
INTRAVENOUS | Status: DC | PRN
  Administered 2022-03-19: 20 mL via INTRAVENOUS

## 2022-03-19 MED ORDER — BUPIVACAINE HCL (PF) 0.5 % IJ SOLN
INTRAMUSCULAR | Status: DC | PRN
  Administered 2022-03-19: 60 mL

## 2022-03-19 MED ORDER — PHENYLEPHRINE HCL-NACL 20-0.9 MG/250ML-% IV SOLN
INTRAVENOUS | Status: AC
  Filled 2022-03-19: qty 250

## 2022-03-19 MED ORDER — NALOXONE HCL 4 MG/10ML IJ SOLN: 1.0000 ug/kg/h | INTRAVENOUS | Status: DC | PRN

## 2022-03-19 MED ORDER — MENTHOL 3 MG MT LOZG: 1.0000 | LOZENGE | OROMUCOSAL | Status: DC | PRN

## 2022-03-19 MED ORDER — SIMETHICONE 80 MG PO CHEW
80.0000 mg | CHEWABLE_TABLET | Freq: Three times a day (TID) | ORAL | Status: DC
  Administered 2022-03-19 – 2022-03-21 (×7): 80 mg via ORAL
  Filled 2022-03-19 (×7): qty 1

## 2022-03-19 SURGICAL SUPPLY — 32 items
APL PRP STRL LF DISP 70% ISPRP (MISCELLANEOUS) ×1
BARRIER ADHS 3X4 INTERCEED (GAUZE/BANDAGES/DRESSINGS) ×2 IMPLANT
BRR ADH 4X3 ABS CNTRL BYND (GAUZE/BANDAGES/DRESSINGS) ×1
CHLORAPREP W/TINT 26 (MISCELLANEOUS) ×2 IMPLANT
DRSG TELFA 3X8 NADH (GAUZE/BANDAGES/DRESSINGS) ×2 IMPLANT
ELECT CAUTERY BLADE 6.4 (BLADE) ×2 IMPLANT
ELECT REM PT RETURN 9FT ADLT (ELECTROSURGICAL) ×2
ELECTRODE REM PT RTRN 9FT ADLT (ELECTROSURGICAL) ×1 IMPLANT
GAUZE SPONGE 4X4 12PLY STRL (GAUZE/BANDAGES/DRESSINGS) ×2 IMPLANT
GLOVE SURG SYN 8.0 (GLOVE) ×2 IMPLANT
GLOVE SURG SYN 8.0 PF PI (GLOVE) ×1 IMPLANT
GOWN STRL REUS W/ TWL LRG LVL3 (GOWN DISPOSABLE) ×2 IMPLANT
GOWN STRL REUS W/ TWL XL LVL3 (GOWN DISPOSABLE) ×1 IMPLANT
GOWN STRL REUS W/TWL LRG LVL3 (GOWN DISPOSABLE) ×4
GOWN STRL REUS W/TWL XL LVL3 (GOWN DISPOSABLE) ×2
MANIFOLD NEPTUNE II (INSTRUMENTS) ×2 IMPLANT
MAT PREVALON FULL STRYKER (MISCELLANEOUS) ×2 IMPLANT
NEEDLE HYPO 22GX1.5 SAFETY (NEEDLE) ×2 IMPLANT
NS IRRIG 1000ML POUR BTL (IV SOLUTION) ×2 IMPLANT
PACK C SECTION AR (MISCELLANEOUS) ×2 IMPLANT
PAD ABD 8X10 STRL (GAUZE/BANDAGES/DRESSINGS) ×1 IMPLANT
PAD DRESSING TELFA 3X8 NADH (GAUZE/BANDAGES/DRESSINGS) ×1 IMPLANT
PAD OB MATERNITY 4.3X12.25 (PERSONAL CARE ITEMS) ×2 IMPLANT
PAD PREP 24X41 OB/GYN DISP (PERSONAL CARE ITEMS) ×2 IMPLANT
SCRUB CHG 4% DYNA-HEX 4OZ (MISCELLANEOUS) ×2 IMPLANT
STRAP SAFETY 5IN WIDE (MISCELLANEOUS) ×2 IMPLANT
SUT CHROMIC 1 CTX 36 (SUTURE) ×6 IMPLANT
SUT PLAIN GUT 0 (SUTURE) ×4 IMPLANT
SUT VIC AB 0 CT1 36 (SUTURE) ×4 IMPLANT
SYR 30ML LL (SYRINGE) ×4 IMPLANT
TAPE PAPER 2X10 WHT MICROPORE (GAUZE/BANDAGES/DRESSINGS) ×1 IMPLANT
WATER STERILE IRR 500ML POUR (IV SOLUTION) ×2 IMPLANT

## 2022-03-19 NOTE — Anesthesia Preprocedure Evaluation (Signed)
Anesthesia Evaluation  Patient identified by MRN, date of birth, ID band Patient awake    Reviewed: Allergy & Precautions, H&P , NPO status , Patient's Chart, lab work & pertinent test results, reviewed documented beta blocker date and time   History of Anesthesia Complications Negative for: history of anesthetic complications  Airway Mallampati: III  TM Distance: >3 FB Neck ROM: full    Dental  (+) Caps, Dental Advidsory Given, Missing   Pulmonary neg shortness of breath, asthma , neg COPD, neg recent URI,    Pulmonary exam normal breath sounds clear to auscultation       Cardiovascular Exercise Tolerance: Good negative cardio ROS Normal cardiovascular exam Rhythm:regular Rate:Normal     Neuro/Psych PSYCHIATRIC DISORDERS Anxiety negative neurological ROS     GI/Hepatic Neg liver ROS, GERD  ,  Endo/Other  negative endocrine ROS  Renal/GU negative Renal ROS  negative genitourinary   Musculoskeletal   Abdominal   Peds  Hematology  (+) Blood dyscrasia, anemia ,   Anesthesia Other Findings Past Medical History: No date: Anemia No date: Anxiety No date: Asthma     Comment:  as a child no tinhalers No date: GERD (gastroesophageal reflux disease) No date: IUGR (intrauterine growth restriction) affecting care of  mother   Reproductive/Obstetrics (+) Pregnancy                             Anesthesia Physical Anesthesia Plan  ASA: 2  Anesthesia Plan: Spinal   Post-op Pain Management:    Induction:   PONV Risk Score and Plan:   Airway Management Planned: Natural Airway and Nasal Cannula  Additional Equipment:   Intra-op Plan:   Post-operative Plan:   Informed Consent: I have reviewed the patients History and Physical, chart, labs and discussed the procedure including the risks, benefits and alternatives for the proposed anesthesia with the patient or authorized representative  who has indicated his/her understanding and acceptance.     Dental Advisory Given  Plan Discussed with: Anesthesiologist, CRNA and Surgeon  Anesthesia Plan Comments:         Anesthesia Quick Evaluation

## 2022-03-19 NOTE — Discharge Summary (Shared)
Obstetrical Discharge Summary  Patient Name: Megan Diaz DOB: Dec 31, 2000 MRN: [id number]  Date of Admission: 03/19/2022 Date of Delivery: 19 March 2022 Delivered by: Megan Gust MD Date of Discharge: 03/21/22  [facility]: [facility]   FIE:PPIRJJO'A last menstrual period was 07/17/2021. EDC Estimated Date of Delivery: 04/16/22 Gestational Age at Delivery: [redacted]w[redacted]d   Antepartum complications: Di/DI twin with IUGR  Admitting Diagnosis:  Secondary Diagnosis: Patient Active Problem List   Diagnosis Date Noted   Cesarean delivery delivered 03/21/2022   Acute blood loss anemia 03/21/2022   IUGR (intrauterine growth restriction) affecting care of mother 03/19/2022   Twin gestation in third trimester 03/19/2022   IUGR (intrauterine growth restriction) affecting care of mother, third trimester, not applicable or unspecified fetus 03/13/2022    Augmentation: N/A Complications: None Intrapartum complications/course: uncomplicated Twins delivery , breech extraction follow by vtx delivery Date of Delivery: 03/21/2022  Delivered By: Megan Gust MD Delivery Type: primary cesarean section, low transverse incision Anesthesia: spinal Placenta: manual Laceration:  Episiotomy: none Newborn Data:   Megan Diaz, Polio [[id number]]  BabyA : TOB 1250 weight 1960 gm Boy apgars 8/9 Baby b TOB 1252 weight 1940 gm Girl Apgars 8/9  Newborn Delivery   Birth date/time:  Delivery type: C-Section, Low Transverse Trial of labor: No C-section categorization: Primary       Megan Diaz, Megan Diaz [[id number]]   Newborn Delivery   Birth date/time:  Delivery type: C-Section, Low Transverse Trial of labor: No C-section categorization: Primary     Risk assessment for postpartum VTE and prophylactic treatment: Very high risk factors: None High risk factors: None Moderate risk factors: None, Multiple gestation , Cesarean delivery , and BMI 30-40 kg/m2  Postpartum VTE prophylaxis with  LMWH not indicated    Postpartum Procedures: none  Edinburgh:     03/20/2022    9:30 AM  Megan Diaz Postnatal Depression Scale Screening Tool  I have been able to laugh and see the funny side of things. 0  I have looked forward with enjoyment to things. 0  I have blamed myself unnecessarily when things went wrong. 1  I have been anxious or worried for no good reason. 2  I have felt scared or panicky for no good reason. 1  Things have been getting on top of me. 0  I have been so unhappy that I have had difficulty sleeping. 0  I have felt sad or miserable. 0  I have been so unhappy that I have been crying. 0  The thought of harming myself has occurred to me. 0  Edinburgh Postnatal Depression Scale Total 4    Post partum course:  Patient had an uncomplicated postpartum course.  By time of discharge on POD#2, her pain was controlled on oral pain medications; she had appropriate lochia and was ambulating, voiding without difficulty, tolerating regular diet and passing flatus.   She was deemed stable for discharge to home.    Discharge Physical Exam:  BP 100/64 (BP Location: Left Arm)   Pulse 75   Temp 98 F (36.7 C)   Resp 20   Ht 5\' 2"  (1.575 m)   Wt 97.1 kg   LMP 07/17/2021   SpO2 98%   Breastfeeding Unknown   BMI 39.14 kg/m   General: NAD CV: RRR Pulm: CTABL, nl effort ABD: s/nd/nt, fundus firm and below the umbilicus Lochia: moderate Incision: c/d/i DVT Evaluation: LE non-ttp, no evidence of DVT on exam.  Hemoglobin  Date Value Ref Range Status  03/20/2022 8.1 (L) 12.0 -  15.0 g/dL Final   HCT  Date Value Ref Range Status  03/20/2022 23.5 (L) 36.0 - 46.0 % Final     Disposition: stable, discharge to home. Baby Feeding: formula Baby Disposition: home with mom  Rh Immune globulin given: n/a Rubella vaccine given: Immune Tdap vaccine given in AP or PP setting: given 01/25/22 Flu vaccine given in AP or PP setting: n/a  Contraception: TBD  Prenatal Labs:   ABO, Rh: A+  Antibody: neg   Rubella: IMM/ varicella Imm  RPR:NR    HBsAg: Neg   HIV: Neg   GBS: not done yet        Plan:  Megan Diaz was discharged to home in good condition. Follow-up appointment with delivering provider in 6 weeks.  Discharge Medications: Allergies as of 03/21/2022       Reactions   Amoxicillin Nausea And Vomiting   Cefzil [cefprozil] Hives        Medication List     STOP taking these medications    aspirin EC 81 MG tablet       TAKE these medications    acetaminophen 500 MG tablet Commonly known as: TYLENOL Take 2 tablets (1,000 mg total) by mouth every 6 (six) hours.   escitalopram 10 MG tablet Commonly known as: LEXAPRO Take 1 tablet by mouth at bedtime.   ferrous sulfate 325 (65 FE) MG tablet Take 325 mg by mouth daily with breakfast.   ibuprofen 600 MG tablet Commonly known as: ADVIL Take 1 tablet (600 mg total) by mouth every 6 (six) hours.   oxyCODONE 5 MG immediate release tablet Commonly known as: Oxy IR/ROXICODONE Take 1 tablet (5 mg total) by mouth every 6 (six) hours as needed for severe pain.   prenatal multivitamin Tabs tablet Take 1 tablet by mouth daily at 12 noon.   ROLAIDS PO Take 1 tablet by mouth as needed.   senna-docusate 8.6-50 MG tablet Commonly known as: Senokot-S Take 2 tablets by mouth daily.   simethicone 80 MG chewable tablet Commonly known as: MYLICON Chew 1 tablet (80 mg total) by mouth 3 (three) times daily after meals.          SignedCyril Diaz 03/21/2022 12:35 PM

## 2022-03-19 NOTE — Anesthesia Procedure Notes (Signed)
Spinal  Patient location during procedure: OB Start time: 03/19/2022 12:10 PM End time: 03/19/2022 12:30 PM Reason for block: surgical anesthesia Staffing Performed: anesthesiologist and resident/CRNA  Anesthesiologist: Lenard Simmer, MD Resident/CRNA: Katherine Basset, CRNA Performed by: Katherine Basset, CRNA Authorized by: Johney Maine D, CRNA   Preanesthetic Checklist Completed: patient identified, IV checked, site marked, risks and benefits discussed, surgical consent, monitors and equipment checked, pre-op evaluation and timeout performed Spinal Block Patient position: sitting Prep: DuraPrep Patient monitoring: heart rate, continuous pulse ox and blood pressure Approach: midline Location: L3-4 Injection technique: single-shot Needle Needle type: Sprotte  Needle gauge: 24 G Needle length: 9 cm Assessment Sensory level: T4 Events: CSF return Additional Notes 1st attempt by Chilton Si CRNA unsuccessful, 2nd attempt by Karlton Lemon MD successful, Pt tolerated throughout

## 2022-03-19 NOTE — Brief Op Note (Signed)
03/19/2022  1:16 PM  PATIENT:  Megan Diaz  21 y.o. female  PRE-OPERATIVE DIAGNOSIS:  DI DI Twins 36+0 - MFM approves  POST-OPERATIVE DIAGNOSIS:  DI DI Twins 36+0- MFM approves  PROCEDURE:  Procedure(s): CESAREAN SECTION (N/A)  SURGEON:  Surgeon(s) and Role:    * Kamiryn Bezanson, Ihor Austin, MD - Primary  PHYSICIAN ASSISTANT: Margaretmary Eddy, CNM   ASSISTANTS: Pa student Chong    ANESTHESIA:   spinal  EBL:  qbl 950 cc IOF 300 cc , Ou 50 cc   BLOOD ADMINISTERED:none  DRAINS: Urinary Catheter (Foley)   LOCAL MEDICATIONS USED:  MARCAINE    and OTHER methergine 0.2 IM   SPECIMEN:  Source of Specimen:  placenta x2   DISPOSITION OF SPECIMEN:  PATHOLOGY  COUNTS:  YES  TOURNIQUET:  * No tourniquets in log *  DICTATION: .Other Dictation: Dictation Number verbal  PLAN OF CARE: Admit to inpatient   PATIENT DISPOSITION:  PACU - hemodynamically stable.   Delay start of Pharmacological VTE agent (>24hrs) due to surgical blood loss or risk of bleeding: not applicable

## 2022-03-19 NOTE — Progress Notes (Signed)
Di/Di twins  with IUGR ( both) 36+0 weeks   Scheduled for primary LTCS . Baby A breech .  NPO . LAbs reviewed . All questions answered . Vancomycin and Gentamycin  being administered .  Proceed

## 2022-03-19 NOTE — Transfer of Care (Signed)
Immediate Anesthesia Transfer of Care Note  Patient: Megan Diaz  Procedure(s) Performed: CESAREAN SECTION  Patient Location: Mother/Baby  Anesthesia Type:Spinal  Level of Consciousness: awake, alert  and oriented  Airway & Oxygen Therapy: Patient Spontanous Breathing and Patient connected to nasal cannula oxygen  Post-op Assessment: Report given to RN and Post -op Vital signs reviewed and stable  Post vital signs: Reviewed and stable  Last Vitals:  Vitals Value Taken Time  BP    Temp    Pulse    Resp    SpO2      Last Pain:  Vitals:   03/19/22 1017  TempSrc:   PainSc: 0-No pain         Complications: No notable events documented.

## 2022-03-19 NOTE — Op Note (Unsigned)
[name], Diaz MEDICAL RECORD NO: [id number] ACCOUNT NO: [id number] DATE OF BIRTH: [birth year] FACILITY: [facility] LOCATION: [facility] PHYSICIAN: Suzy Bouchard, MD  Operative Report   DATE OF PROCEDURE: 03/19/2022  PREOPERATIVE DIAGNOSES: 1.  Diamniotic dichorionic twin gestation. 2.  36+0 weeks. 3.  Intrauterine fetal growth restriction fetus A and fetus B. 4.  Baby A, breech presentation.  POSTOPERATIVE DIAGNOSES: 1.  Diamniotic dichorionic twin gestation. 2.  36+0 weeks. 3.  Intrauterine fetal growth restriction fetus A and fetus B. 4.  Baby A, breech presentation. 5.  Vigorous baby A, female, delivered. 6.  Vigorous female baby B delivered.  PROCEDURE:  Primary low transverse cesarean section with breech extraction baby A, vertex extraction baby B.  ANESTHESIA:  Spinal.  SURGEON:  Suzy Bouchard, MD  ASSISTANT:  Megan Diaz, certified nurse midwife.  SECOND ASSISTANT:  PA student, Megan Diaz.  INDICATIONS:  A 21 year old gravida 1, para 0 with diamniotic dichorionic twin gestation, who is being followed by Maternal Fetal Medicine and [facility] for intrauterine growth restriction of both baby A and B.  Given the significant growth  restriction Maternal Fetal Medicine recommended delivery promptly at 36 weeks' gestation.  Baby A breech presentation.  DESCRIPTION OF PROCEDURE:  After adequate spinal anesthesia, the patient was placed in the dorsal supine position, hip roll placed under the right side.  The patient's vagina, perineum and abdomen were prepped and draped in normal sterile fashion.  The  patient did receive vancomycin and gentamicin for surgical prophylaxis prior to commencement of the case.  Timeout was performed.  A Pfannenstiel incision was made 2 fingerbreadths above the symphysis pubis.  Sharp dissection was used to identify the  fascia.  Fascia was opened in the midline and opened in transverse fashion.  The superior aspect of the fascia  was grasped with Kocher clamps and the recti muscles were dissected free.  Inferior aspect of the fascia was grasped with Kocher clamps and  pyramidalis muscle was dissected free.  Entry into the peritoneal cavity was accomplished sharply.  The vesicouterine peritoneal fold was identified and bladder flap was created and the bladder was reflected inferiorly.  A low transverse uterine incision  was made.  Upon entry into the endometrial cavity the first gestational sac was opened with clear fluid.  A footling breech was identified and the legs were grasped, followed by delivery of the arms with a sweeping medial maneuver and a  Mauriceau-Smellie-Veit maneuver to deliver the fetal head.  Vigorous female was then dried on the mother's abdomen for 60 seconds and the cord was doubly clamped and infant was passed to neonatologist who had assigned Apgar scores of 8 and 9, fetal weight  1960 grams.  The amniotic cavity for baby B was then ruptured and head was brought to the incision and with fundal pressure the head, shoulders and body were delivered.  Vigorous female was then dried on the abdomen for 60 seconds and the cord was doubly  clamped and vigorous female was passed to neonatologist who had assigned Apgar scores of 8 and 9, fetal weight 1940 grams.  The placentas were then delivered manually.  Baby A placenta had one umbilical clamp and baby B umbilical cord had 2 umbilical  clamps.  Intravenous Pitocin was administered while the uterus was exteriorized.  Endometrial cavity was wiped clean with laparotomy tape.  Given the amount of atony of the uterus 0.2 mg of Methergine was administered intramuscularly.  The uterine  incision was then closed with #  1 chromic suture in a running locking fashion, good approximation of edges.  Good hemostasis noted.  Fallopian tubes and ovaries appeared normal.  Posterior cul-de-sac was irrigated and suctioned and the uterus was placed  back in the abdominal cavity and the  pericolic gutters were wiped clean with laparotomy tape.  Uterine incision again appeared hemostatic and Interceed was placed over the uterine incision in a T-shaped fashion.  Fascia was then closed with 0 Vicryl  suture in a running nonlocking fashion, good approximation of edges.  Good hemostasis noted.  The fascial edges were injected with a solution of 60 mL of 0.5% Marcaine plus 20 mL normal saline.  40 mL of the solution was injected interfascially.   Subcutaneous tissues were irrigated and bovied for hemostasis and the skin was reapproximated with Insorb absorbable staples with good cosmetic effect.  Additional 30 mL of Marcaine solution was injected beneath the skin.  There were no complications.  QUANTITATIVE BLOOD LOSS:  950 mL.  INTRAOPERATIVE FLUIDS:  300 mL.  URINE OUTPUT:  60 mL.  The patient tolerated the procedure well and was taken to recovery room in good condition.   PUS D: 03/19/2022 1:37:55 pm T: 03/19/2022 2:05:00 pm  JOB: [id number]/ [id number]

## 2022-03-20 LAB — CBC
HCT: 23.5 % — ABNORMAL LOW (ref 36.0–46.0)
Hemoglobin: 8.1 g/dL — ABNORMAL LOW (ref 12.0–15.0)
MCH: 29.1 pg (ref 26.0–34.0)
MCHC: 34.5 g/dL (ref 30.0–36.0)
MCV: 84.5 fL (ref 80.0–100.0)
Platelets: 247 10*3/uL (ref 150–400)
RBC: 2.78 MIL/uL — ABNORMAL LOW (ref 3.87–5.11)
RDW: 12.7 % (ref 11.5–15.5)
WBC: 14.7 10*3/uL — ABNORMAL HIGH (ref 4.0–10.5)
nRBC: 0 % (ref 0.0–0.2)

## 2022-03-20 MED ORDER — LACTATED RINGERS IV BOLUS
500.0000 mL | Freq: Once | INTRAVENOUS | Status: AC
  Administered 2022-03-20: 500 mL via INTRAVENOUS

## 2022-03-20 MED ORDER — LACTATED RINGERS IV SOLN
INTRAVENOUS | Status: DC
Start: 2022-03-20 — End: 2022-03-21

## 2022-03-20 MED ORDER — LACTATED RINGERS IV BOLUS: 500.0000 mL | Freq: Once | INTRAVENOUS | Status: DC

## 2022-03-20 MED ORDER — SODIUM CHLORIDE 0.9 % IV SOLN
300.0000 mg | Freq: Once | INTRAVENOUS | Status: AC
  Administered 2022-03-20: 300 mg via INTRAVENOUS
  Filled 2022-03-20: qty 15

## 2022-03-20 NOTE — Anesthesia Postprocedure Evaluation (Signed)
Anesthesia Post Note  Patient: Megan Diaz  Procedure(s) Performed: CESAREAN SECTION  Patient location during evaluation: Mother Baby Anesthesia Type: Spinal Level of consciousness: oriented and awake and alert Pain management: pain level controlled Vital Signs Assessment: post-procedure vital signs reviewed and stable Respiratory status: spontaneous breathing and respiratory function stable Cardiovascular status: blood pressure returned to baseline and stable Postop Assessment: no headache, no backache, no apparent nausea or vomiting and able to ambulate Anesthetic complications: no   No notable events documented.   Last Vitals:  Vitals:   03/20/22 0600 03/20/22 0700  BP:    Pulse: 81 90  Resp:    Temp:    SpO2:      Last Pain:  Vitals:   03/20/22 0405  TempSrc:   PainSc: 0-No pain                 Shawnn Bouillon Lawerance Cruel

## 2022-03-20 NOTE — Progress Notes (Signed)
Post Partum Day 1 Subjective: Doing well, no complaints.  Tolerating regular diet, pain with PO meds, voiding and ambulating without difficulty.  No CP SOB Fever,Chills, N/V or leg pain; denies nipple or breast pain, no HA change of vision, RUQ/epigastric pain  Objective: BP 115/72 (BP Location: Right Arm)   Pulse 78   Temp 98.2 F (36.8 C)   Resp 18   Ht 5\' 2"  (1.575 m)   Wt 97.1 kg   LMP 07/17/2021   SpO2 98%   Breastfeeding Unknown   BMI 39.14 kg/m    Vitals:   03/19/22 1330 03/19/22 1345 03/19/22 1400 03/19/22 1415  BP: 122/72 124/74 110/69 111/73   03/19/22 1445 03/19/22 1515 03/19/22 1540 03/19/22 1704  BP: 119/76 115/72 124/72 114/72   03/19/22 1941 03/19/22 2334 03/20/22 0328 03/20/22 0839  BP: 118/77 122/82 112/64 115/72     Physical Exam:  General: NAD Breasts: soft/nontender Pulm: nl effort Abdomen: soft, NT, BS x 4 Incision: Pressure Dsg CDI Lochia: small Uterine Fundus: fundus firm and 2 fb below umbilicus DVT Evaluation: no cords, ttp LEs   Recent Labs    03/20/22 0540  HGB 8.1*  HCT 23.5*  WBC 14.7*  PLT 247    Assessment/Plan: 20 y.o. G1P0102 postpartum day # 1  - Continue routine PP care; TED hose, encouraged ambulation, remove Foley and pressure dsg today, apply honeycomb.  - encouraged snug bra for formula feeding.  - Acute blood loss anemia - hemodynamically stable and asymptomatic; start po ferrous sulfate BID with stool softeners. Ordered Venofer today.     Disposition: Does not desire Dc home today.     05/20/22, CNM 03/20/2022  8:39 AM

## 2022-03-20 NOTE — Anesthesia Post-op Follow-up Note (Signed)
  Anesthesia Pain Follow-up Note  Patient: [name]  Day #: 1  Date of Follow-up: 03/20/2022 Time: 8:13 AM  Last Vitals:  Vitals:   03/20/22 0600 03/20/22 0700  BP:    Pulse: 81 90  Resp:    Temp:    SpO2:      Level of Consciousness: alert  Pain: none   Side Effects:None  Catheter Site Exam:clean, dry, no drainage  Anti-Coag Meds (From admission, onward)   Start     Dose/Rate Route Frequency Ordered Stop   03/20/22 1300  enoxaparin (LOVENOX) injection 40 mg        40 mg Subcutaneous Every 24 hours 03/19/22 1343         Plan: D/C from anesthesia care at surgeon's request  Kimbely Whiteaker Lawerance Cruel

## 2022-03-21 DIAGNOSIS — D62 Acute posthemorrhagic anemia: Secondary | ICD-10-CM

## 2022-03-21 MED ORDER — SENNOSIDES-DOCUSATE SODIUM 8.6-50 MG PO TABS: 2.0000 | ORAL_TABLET | Freq: Every day | ORAL | Status: AC

## 2022-03-21 MED ORDER — ACETAMINOPHEN 500 MG PO TABS: 1000.0000 mg | ORAL_TABLET | Freq: Four times a day (QID) | ORAL | 0 refills | Status: AC

## 2022-03-21 MED ORDER — OXYCODONE HCL 5 MG PO TABS: 5.0000 mg | ORAL_TABLET | Freq: Four times a day (QID) | ORAL | 0 refills | Status: AC | PRN

## 2022-03-21 MED ORDER — IBUPROFEN 600 MG PO TABS: 600.0000 mg | ORAL_TABLET | Freq: Four times a day (QID) | ORAL | 0 refills | Status: AC

## 2022-03-21 MED ORDER — SIMETHICONE 80 MG PO CHEW: 80.0000 mg | CHEWABLE_TABLET | Freq: Three times a day (TID) | ORAL | 0 refills | Status: AC

## 2022-03-21 NOTE — Progress Notes (Signed)
Mother discharged. Discharge instructions given. Mother verbalizes understanding. Transported by axillary.  

## 2022-03-22 ENCOUNTER — Other Ambulatory Visit: Payer: BC Managed Care – PPO

## 2022-03-22 ENCOUNTER — Ambulatory Visit: Payer: BC Managed Care – PPO

## 2022-03-22 LAB — SURGICAL PATHOLOGY

## 2022-11-25 ENCOUNTER — Ambulatory Visit: Admission: EM | Admit: 2022-11-25 | Discharge status: Against Medical Advice | Payer: BC Managed Care – PPO

## 2024-03-02 ENCOUNTER — Encounter: Payer: Self-pay | Admitting: Nurse Practitioner

## 2024-03-02 ENCOUNTER — Other Ambulatory Visit: Payer: Self-pay | Admitting: Nurse Practitioner

## 2024-03-02 DIAGNOSIS — E010 Iodine-deficiency related diffuse (endemic) goiter: Secondary | ICD-10-CM

## 2024-03-05 ENCOUNTER — Ambulatory Visit
Admission: RE | Admit: 2024-03-05 | Discharge: 2024-03-05 | Disposition: A | Discharge status: Home / Self Care | Source: Ambulatory Visit | Attending: Nurse Practitioner | Admitting: Nurse Practitioner

## 2024-03-05 DIAGNOSIS — E010 Iodine-deficiency related diffuse (endemic) goiter: Secondary | ICD-10-CM | POA: Insufficient documentation

## 2024-03-13 IMAGING — US US MFM OB LIMITED
1 series · 13 of 22 positions shown · non-contrast
Comparison: none

[Series 1: us mfm ob limited · 13 of 22 slices shown]
[im 1/22]
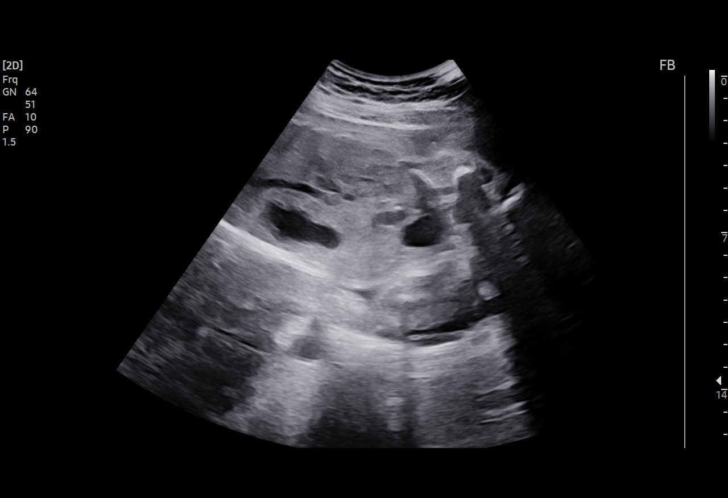
[im 3/22]
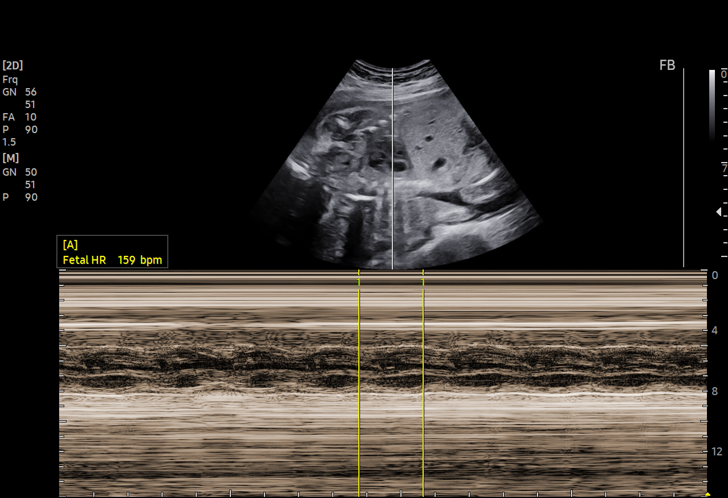
[im 5/22]
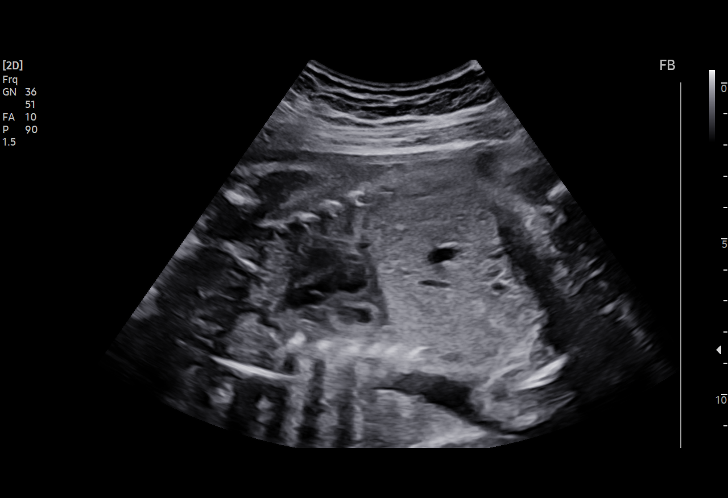
[im 6/22]
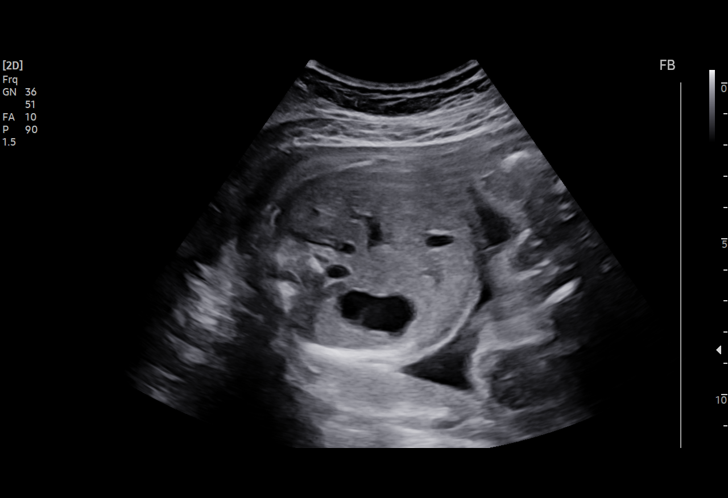
[im 8/22]
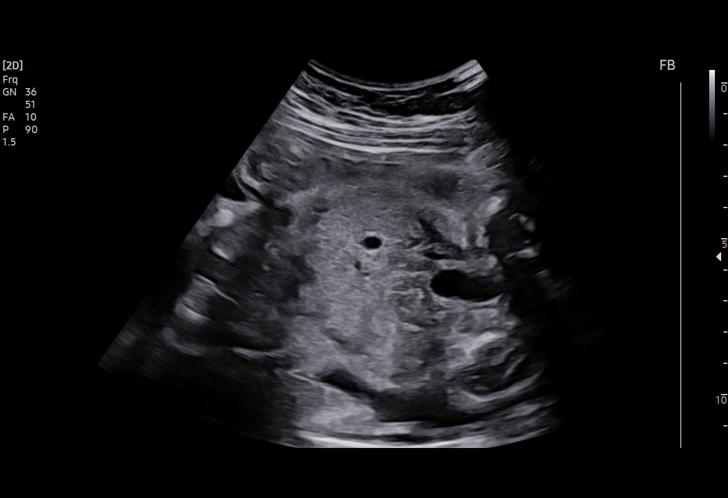
[im 10/22]
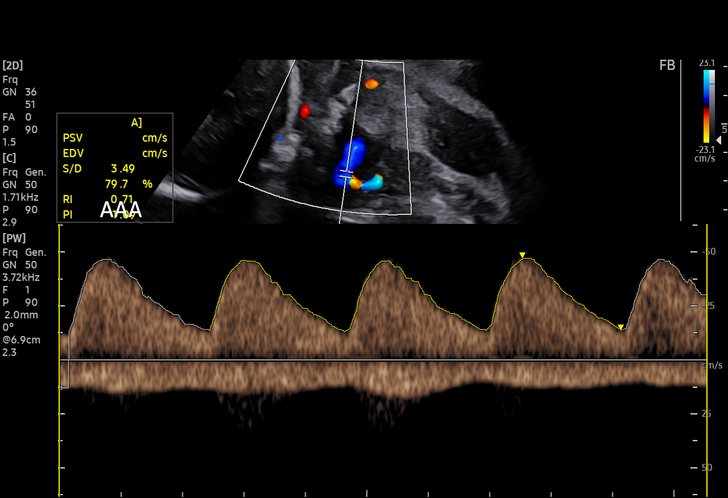
[im 12/22]
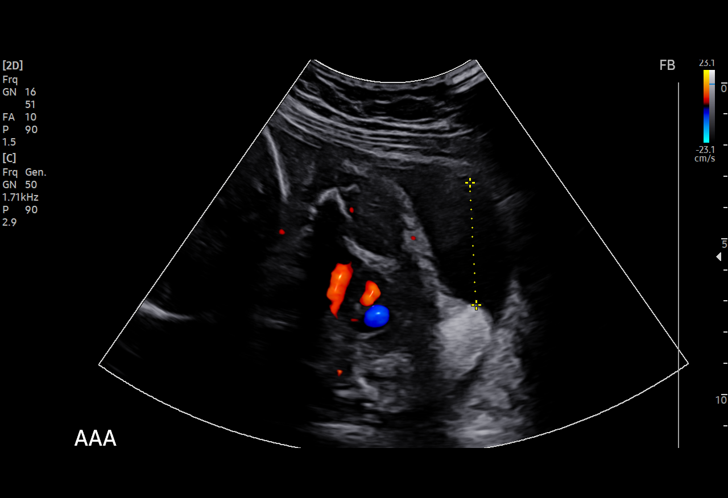
[im 13/22]
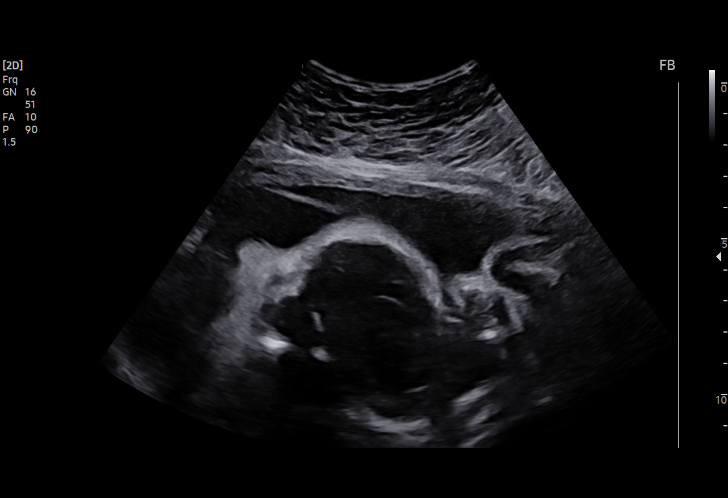
[im 15/22]
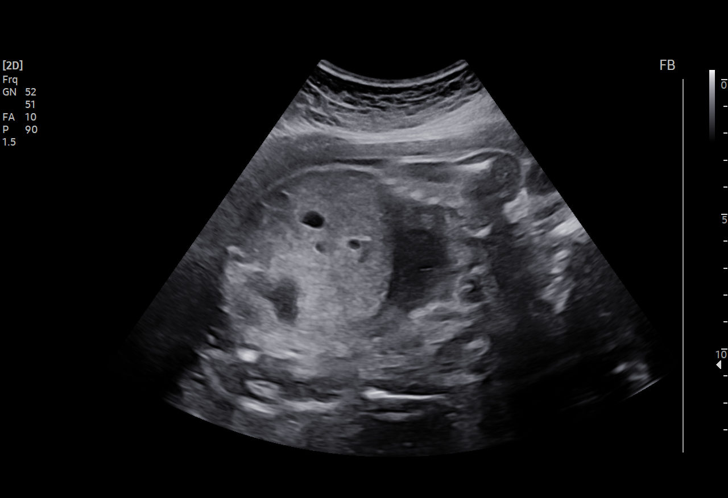
[im 17/22]
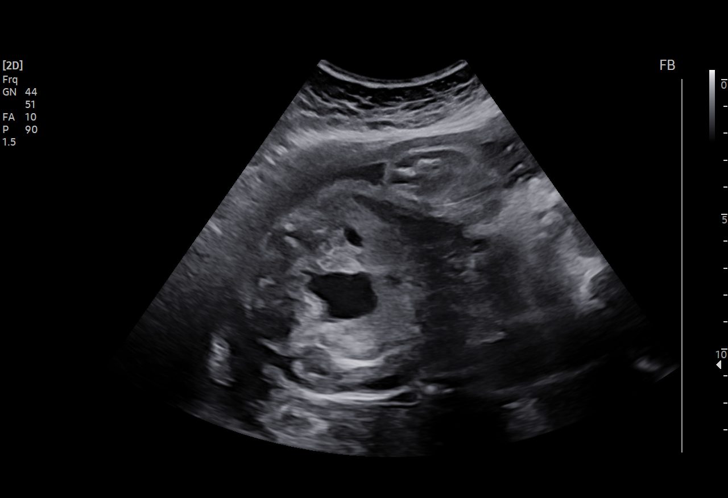
[im 18/22]
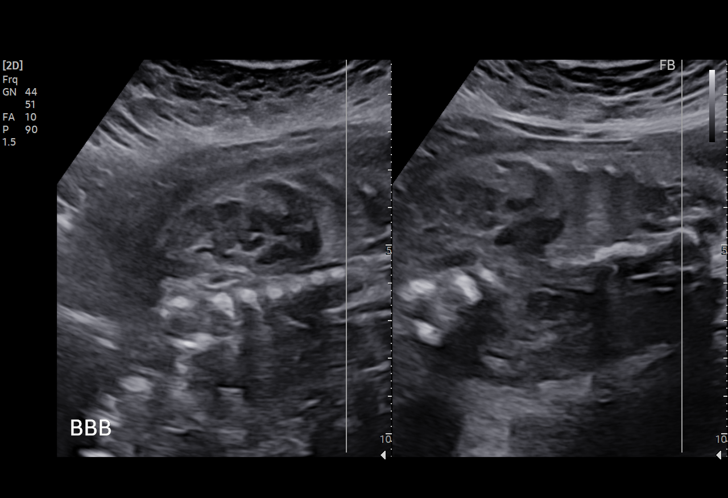
[im 20/22]
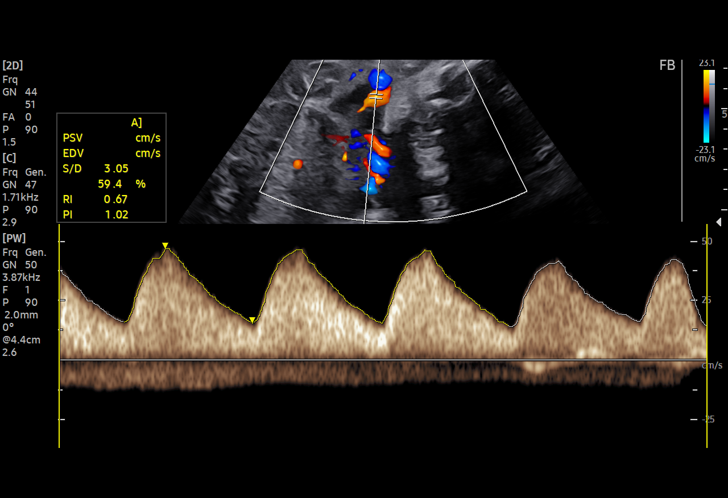
[im 22/22]
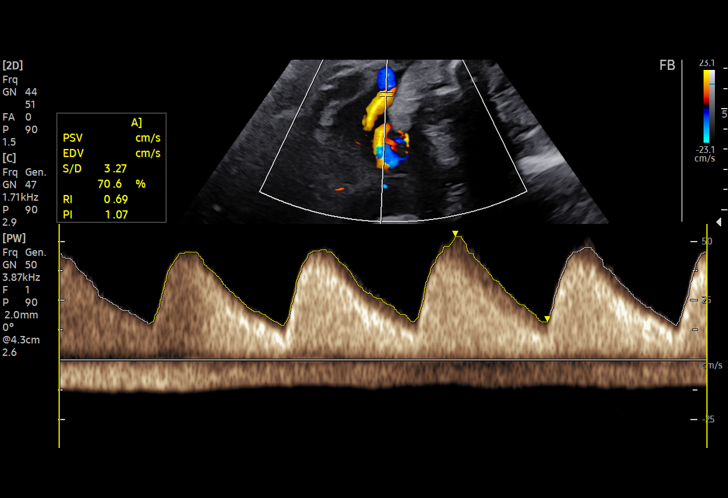

[13 of 22 positions shown; findings below may reference images not displayed]

Indications

 Maternal care for known or suspected poor
 fetal growth, third trimester, fetus 1 IUGR
 Maternal care for known or suspected poor
 fetal growth, third trimester, fetus 2 IUGR
 Twin pregnancy, di/di, third trimester
 Anxiety during pregnancy, third trimester      O99.343,
 Asthma                                         5UU.GU j73.010
 29 weeks gestation of pregnancy
Fetal Evaluation (Fetus A)

 Num Of Fetuses:         2
 Fetal Heart Rate(bpm):  159
 Cardiac Activity:       Observed
 Fetal Lie:              Maternal right side
 Presentation:           Transverse, head to maternal right
 Placenta:               Posterior
 P. Cord Insertion:      Previously Visualized
 Membrane Desc:      Dividing Membrane seen - Dichorionic.

 Amniotic Fluid
 AFI FV:      Within normal limits

                             Largest Pocket(cm)

OB History
 Gravidity:    1
Gestational Age (Fetus A)

 LMP:           28w 4d        Date:  07/17/21                 EDD:   04/23/22
 Best:          29w 4d     Det. By:  Early Ultrasound         EDD:   04/16/22
                                     (09/19/21)
Doppler - Fetal Vessels (Fetus A)

 Umbilical Artery
  S/D     %tile      RI    %tile      PI    %tile     PSV    ADFV    RDFV
                                                    (cm/s)
  3.21       68    0.69       73    1.[REDACTED]      No      No

Fetal Evaluation (Fetus B)

 Num Of Fetuses:         2
 Fetal Heart Rate(bpm):  136
 Cardiac Activity:       Observed
 Fetal Lie:              Maternal left side
 Presentation:           Transverse, head to maternal left
 Placenta:               Posterior
 P. Cord Insertion:      Previously Visualized
 Membrane Desc:      Dividing Membrane seen - Dichorionic.

 Amniotic Fluid
 AFI FV:      Within normal limits

                             Largest Pocket(cm)

Gestational Age (Fetus B)

 LMP:           28w 4d        Date:  07/17/21                 EDD:   04/23/22
 Best:          29w 4d     Det. By:  Early Ultrasound         EDD:   04/16/22
                                     (09/19/21)
Doppler - Fetal Vessels (Fetus B)

 Umbilical Artery
  S/D     %tile      RI    %tile      PI    %tile     PSV    ADFV    RDFV
                                                    (cm/s)
  2.83       48    0.65       53    0.[REDACTED]      No      No

Comments
  This patient was seen due to IUGR of both fetuses in a
 dichorionic, diamniotic twin gestation.  She denies any
 problems since her last exam.  She reports feeling vigorous
 fetal movements of both fetuses throughout the day.
 There was normal amniotic fluid noted on today's ultrasound
 exam around both twin A and twin B.
 Doppler studies of the umbilical arteries performed due to
 fetal growth restriction showed a normal S/D ratio for both
 twin A and twin B.  There were no signs of absent or reversed
 end-diastolic flow noted today in either fetus.
 An NST was unable to be obtained for both fetuses due to
 vigorous fetal movements despite over 1 hour of monitoring.
 Vigorous fetal movements of both fetuses were noted during
 her ultrasound exam.
 The patient was advised that as long as she continues to feel
 vigorous fetal movements, it is presumed that both fetuses
 are doing well.
 She will return in 1 week for a BPP, umbilical artery Doppler
 study, and growth scan.

 Delivery for fetal growth restriction in a dichorionic twin
 gestation is usually recommended at around 36 to 37 weeks
 as long as the fetal testing and umbilical artery Doppler
 studies remain within normal limits.

## 2024-03-19 IMAGING — US US MFM OB FOLLOW-UP EACH ADDL GEST (MODIFY)
1 series · 14 of 28 positions shown · non-contrast
Comparison: none

[Series 1: us mfm ob follow-up each addl gest (modify) · 87 acquisitions, 14 frames shown]
[im 4/87]
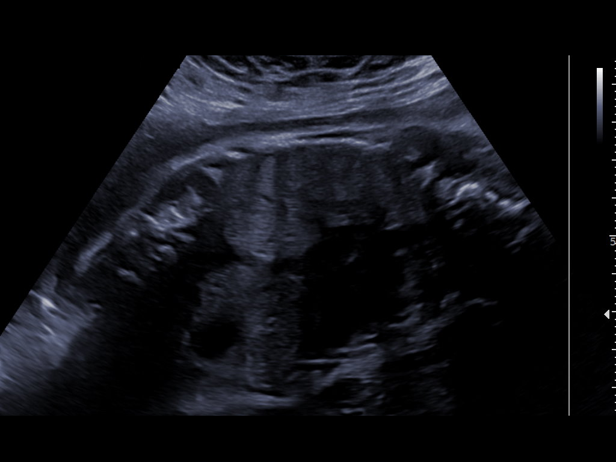
[im 10/87]
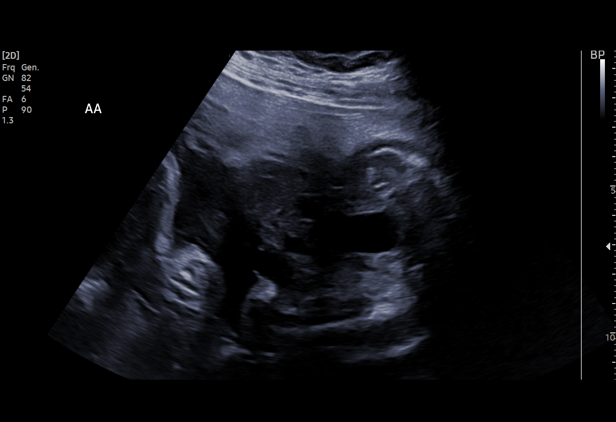
[im 16/87]
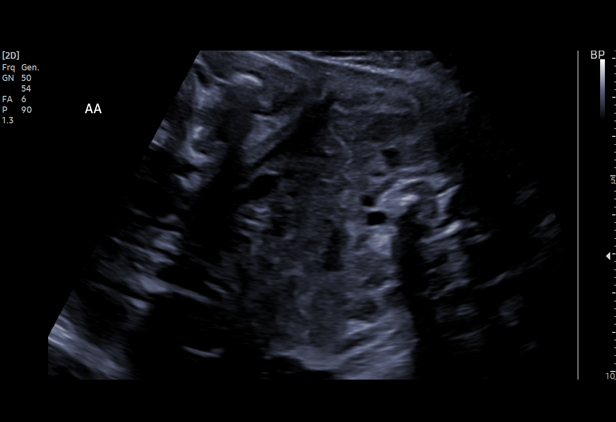
[im 23/87]
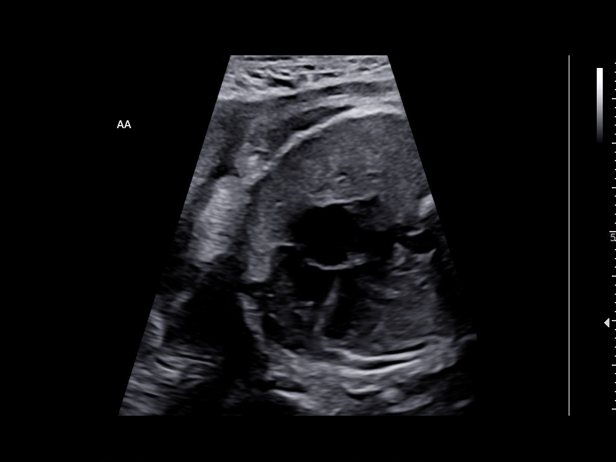
[im 29/87]
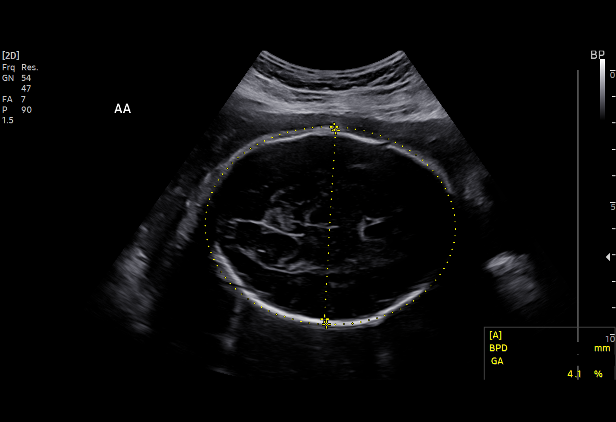
[im 36/87]
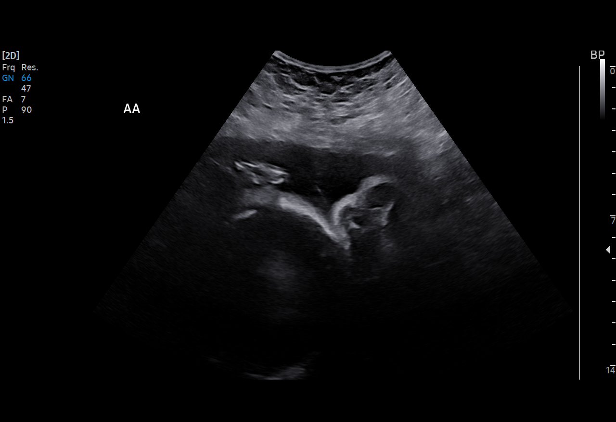
[im 42/87]
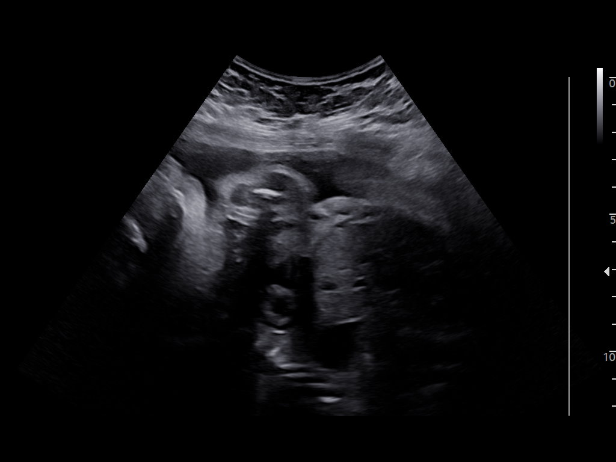
[im 48/87]
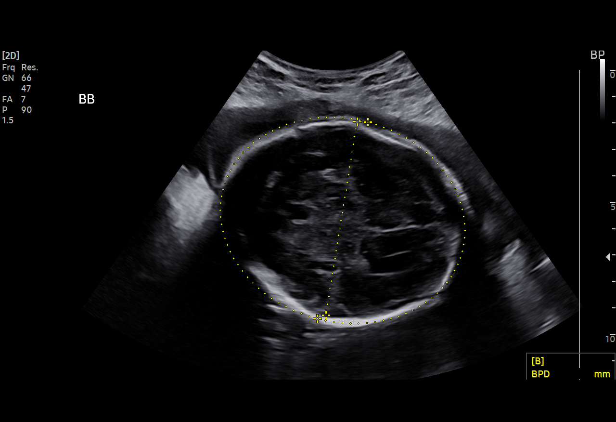
[im 55/87]
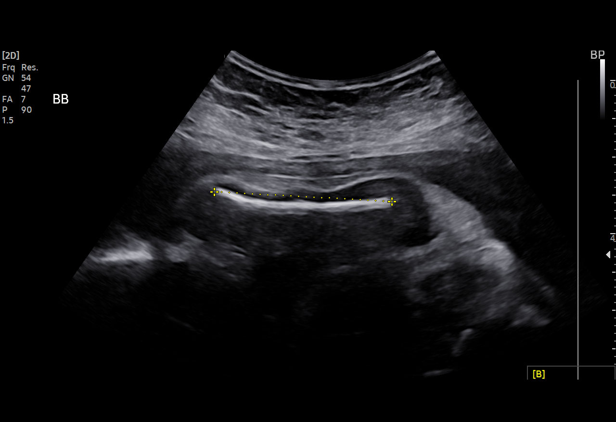
[im 61/87]
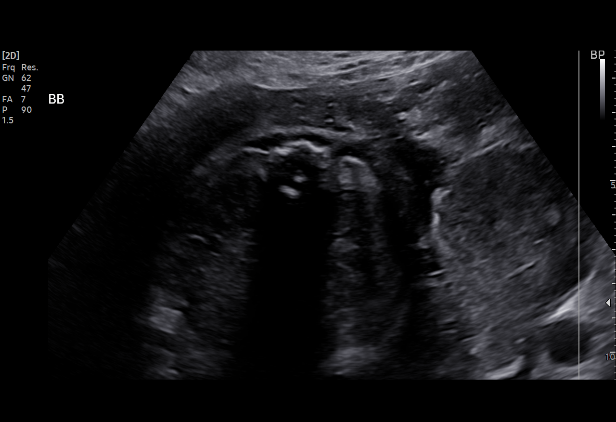
[im 67/87]
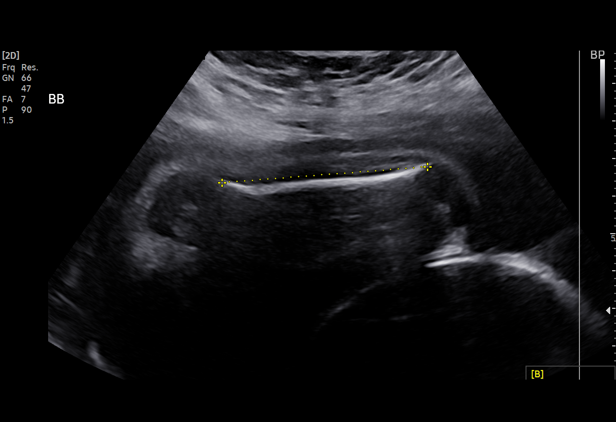
[im 74/87]
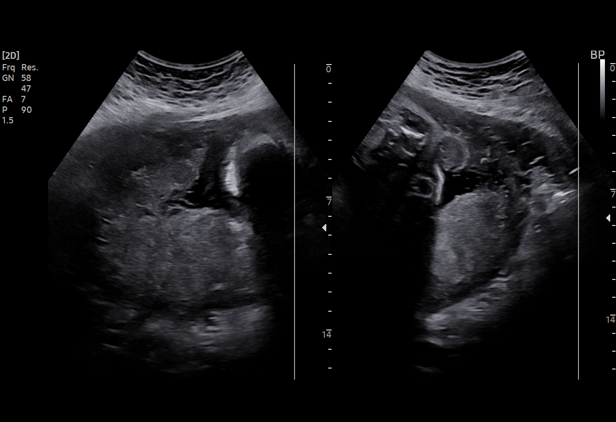
[im 80/87]
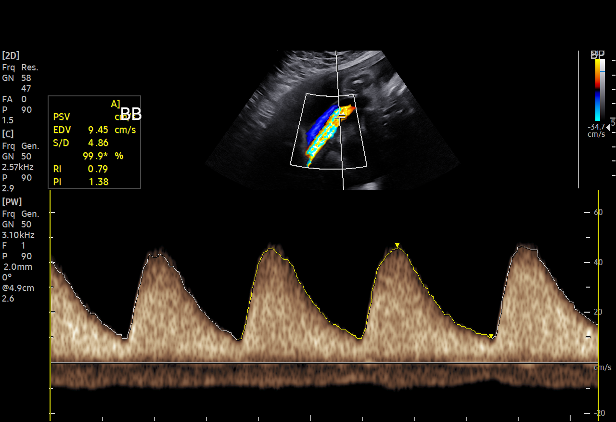
[im 87/87]
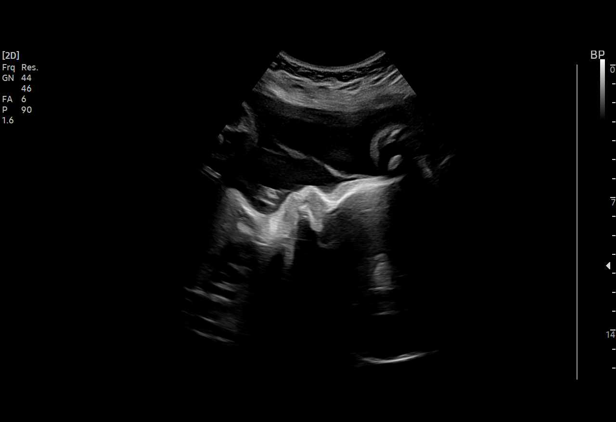

[14 of 28 positions shown; findings below may reference images not displayed]

GEST
    ADDL GESTATION

Indications

 Maternal care for known or suspected poor
 fetal growth, third trimester, fetus 1 IUGR
 Maternal care for known or suspected poor
 fetal growth, third trimester, fetus 2 IUGR
 Twin pregnancy, di/di, third trimester
 Anxiety during pregnancy, third trimester      O99.343,
 Asthma                                         DEE.9E j17.979
 30 weeks gestation of pregnancy
Fetal Evaluation (Fetus A)

 Num Of Fetuses:         2
 Fetal Heart Rate(bpm):  145
 Cardiac Activity:       Observed
 Fetal Lie:              Maternal right side
 Presentation:           Breech
 Placenta:               Posterior
 P. Cord Insertion:      Previously Visualized
 Membrane Desc:      Dividing Membrane seen - Dichorionic.
 Amniotic Fluid
 AFI FV:      Within normal limits

                             Largest Pocket(cm)

Biophysical Evaluation (Fetus A)

 Amniotic F.V:   Pocket => 2 cm             F. Tone:        Observed
 F. Movement:    Observed                   Score:          [DATE]
 F. Breathing:   Observed
Biometry (Fetus A)

 BPD:     71.82  mm     G. Age:  28w 6d        4.9  %    CI:        72.63   %    70 - 86
                                                         FL/HC:      19.0   %    19.2 -
 HC:       268   mm     G. Age:  29w 1d        2.2  %    HC/AC:      1.18        0.99 -
 AC:      227.8  mm     G. Age:  27w 1d        < 1  %    FL/BPD:     70.9   %    71 - 87
 FL:       50.9  mm     G. Age:  27w 2d        < 1  %    FL/AC:      22.3   %    20 - 24
 HUM:      46.6  mm     G. Age:  27w 3d        < 5  %

 LV:        3.8  mm

 Est. FW:    7092  gm      2 lb 6 oz    < 1  %     FW Discordancy        17  %
OB History

 Gravidity:    1
Gestational Age (Fetus A)

 LMP:           29w 3d        Date:  07/17/21                 EDD:   04/23/22
 U/S Today:     28w 1d                                        EDD:   05/02/22
 Best:          30w 3d     Det. By:  Early Ultrasound         EDD:   04/16/22
                                     (09/19/21)
Anatomy (Fetus A)

 Cranium:               Appears normal         Aortic Arch:            Previously seen
 Cavum:                 Appears normal         Ductal Arch:            Previously seen
 Ventricles:            Appears normal         Diaphragm:              Appears normal
 Choroid Plexus:        Previously seen        Stomach:                Appears normal, left
                                                                       sided
 Cerebellum:            Appears normal         Abdomen:                Appears normal
 Posterior Fossa:       Appears normal         Abdominal Wall:         Previously seen
 Nuchal Fold:           Not applicable (< 16   Cord Vessels:           Previously seen
                        wks GA)
 Face:                  Orbits and profile     Kidneys:                Appear normal
                        previously seen
 Lips:                  Previously seen        Bladder:                Appears normal
 Thoracic:              Appears normal         Spine:                  Previously seen
 Heart:                 Appears normal         Upper Extremities:      Previously seen
                        (4CH, axis, and
                        situs)
 RVOT:                  Previously seen        Lower Extremities:      Previously seen
 LVOT:                  Previously seen

 Other:  Male gender previously seen. VC, 3VV and 3VTV prev visualized.
         Technically difficult due to fetal position. Right hand and feet remain
         suboptimal
Doppler - Fetal Vessels (Fetus A)

 Umbilical Artery
  S/D     %tile      RI    %tile      PI    %tile     PSV
                                                    (cm/s)
  2.67       42    0.63       47    0.[REDACTED]

Fetal Evaluation (Fetus B)

 Num Of Fetuses:         2
 Fetal Heart Rate(bpm):  143
 Cardiac Activity:       Observed
 Fetal Lie:              Maternal left side
 Presentation:           Cephalic
 Placenta:               Posterior
 P. Cord Insertion:      Previously Visualized
 Membrane Desc:      Dividing Membrane seen - Dichorionic.

 Amniotic Fluid
 AFI FV:      Within normal limits

                             Largest Pocket(cm)

Biophysical Evaluation (Fetus B)

 Amniotic F.V:   Pocket => 2 cm             F. Tone:        Observed
 F. Movement:    Observed                   Score:          [DATE]
 F. Breathing:   Observed
Biometry (Fetus B)

 BPD:     73.67  mm     G. Age:  29w 4d         15  %    CI:         76.3   %    70 - 86
                                                         FL/HC:      20.5   %    19.2 -
 HC:    267.25   mm     G. Age:  29w 1d        1.8  %    HC/AC:      1.09        0.99 -
 AC:      244.7  mm     G. Age:  28w 5d          7  %    FL/BPD:     74.5   %    71 - 87
 FL:      54.91  mm     G. Age:  29w 0d          7  %    FL/AC:      22.4   %    20 - 24
 HUM:      46.5  mm     G. Age:  27w 3d        < 5  %
 LV:        5.5  mm

 Est. FW:    5949  gm    2 lb 14 oz       5  %     FW Discordancy     0 \ 17 %
Gestational Age (Fetus B)

 LMP:           29w 3d        Date:  07/17/21                 EDD:   04/23/22
 U/S Today:     29w 1d                                        EDD:   04/25/22
 Best:          30w 3d     Det. By:  Early Ultrasound         EDD:   04/16/22
                                     (09/19/21)
Anatomy (Fetus B)

 Cranium:               Appears normal         Aortic Arch:            Previously seen
 Cavum:                 Appears normal         Ductal Arch:            Previously seen
 Ventricles:            Appears normal         Diaphragm:              Appears normal
 Choroid Plexus:        Previously seen        Stomach:                Appears normal, left
                                                                       sided
 Cerebellum:            Appears normal         Abdomen:                Appears normal
 Posterior Fossa:       Appears normal         Abdominal Wall:         Previously seen
 Nuchal Fold:           Not applicable (< 16   Cord Vessels:           Previously seen
                        wks GA)
 Face:                  Orbits and profile     Kidneys:                Appear normal
                        previously seen
 Lips:                  Previously seen        Bladder:                Appears normal
 Thoracic:              Appears normal         Spine:                  Previously seen
 Heart:                 Appears normal         Upper Extremities:      Previously seen
                        (4CH, axis, and
                        situs)
 RVOT:                  Previously seen        Lower Extremities:      Previously seen
 LVOT:                  Previously seen

 Other:  Female gender previously seen. Technically difficult due to fetal
         position.
Doppler - Fetal Vessels (Fetus B)

 Umbilical Artery
  S/D     %tile      RI    %tile      PI    %tile     PSV    ADFV    RDFV
                                                    (cm/s)
  3.52       83    0.72       85    1.[REDACTED]      No      No

Cervix Uterus Adnexa

 Cervix
 Not visualized (advanced GA >99wks)

 Uterus
 Normal shape and size.

 Right Ovary
 Within normal limits.

 Left Ovary
 Not visualized.
Impression
 Dichorionic-diamniotic twin pregnancy with fetal growth
 restriction in both twins.

 Twin A: Maternal right, breech presentation, posterior
 placenta, male fetus.  Amniotic fluid is normal and good fetal
 activity seen.  The estimated fetal weight is at the 1st
 percentile and the abdominal circumference measurement is
 at the 1st percentile.  Head circumference measurement is at
 between -1 SD and mean (normal).  Interval weight gain is
 301 g (suboptimal growth).
 Umbilical artery Doppler showed normal forward diastolic
 flow.  BPP [DATE].

 Twin B: Maternal right, cephalic presentation, posterior
 placenta, female fetus.  The estimated fetal weight is at the
 5th percentile and the abdominal circumference
 measurement is at the 7th percentile.  Head circumference
 measurement is at between -1 SD and mean (normal).
 Interval weight gain is 492 g.
 Umbilical artery Doppler showed normal forward diastolic
 flow.  BPP [DATE].

 Growth discordancy: 17%.
 I explained the finding of fetal growth restriction and our
 protocol of frequent ultrasound monitoring.  We will discuss
 timing of delivery after future fetal growth assessments.
Recommendations

 -Continue weekly BPP and UA Doppler studies.
 -Fetal growth assessment in 3 weeks.
                Tank, Osman
